# Patient Record
Sex: Female | Born: 1955 | Race: White | Hispanic: No | Marital: Married | Smoking: Former smoker
Health system: Southern US, Community
[De-identification: ages and names within clinical notes are randomized; demographics above are authoritative.]

## PROBLEM LIST (undated history)

## (undated) DIAGNOSIS — Z789 Other specified health status: Secondary | ICD-10-CM

## (undated) DIAGNOSIS — T7840XA Allergy, unspecified, initial encounter: Secondary | ICD-10-CM

## (undated) HISTORY — PX: WISDOM TOOTH EXTRACTION: SHX21

## (undated) HISTORY — PX: TONSILLECTOMY: SUR1361

## (undated) HISTORY — DX: Allergy, unspecified, initial encounter: T78.40XA

## (undated) HISTORY — PX: COLONOSCOPY: SHX174

## (undated) HISTORY — DX: Other specified health status: Z78.9

---

## 2005-12-26 LAB — HM COLONOSCOPY

## 2006-12-26 LAB — HM PAP SMEAR: HM Pap smear: NORMAL

## 2008-01-25 LAB — CONVERTED CEMR LAB: Pap Smear: NORMAL

## 2008-12-26 LAB — HM MAMMOGRAPHY: HM Mammogram: NORMAL

## 2009-02-04 ENCOUNTER — Ambulatory Visit: Payer: Self-pay | Admitting: *Deleted

## 2009-02-04 ENCOUNTER — Ambulatory Visit: Payer: Self-pay | Admitting: Diagnostic Radiology

## 2009-02-04 ENCOUNTER — Ambulatory Visit (HOSPITAL_BASED_OUTPATIENT_CLINIC_OR_DEPARTMENT_OTHER): Admission: RE | Admit: 2009-02-04 | Discharge: 2009-02-04 | Payer: Self-pay | Admitting: *Deleted

## 2009-02-04 DIAGNOSIS — J309 Allergic rhinitis, unspecified: Secondary | ICD-10-CM | POA: Insufficient documentation

## 2009-02-04 DIAGNOSIS — E785 Hyperlipidemia, unspecified: Secondary | ICD-10-CM | POA: Insufficient documentation

## 2009-02-10 ENCOUNTER — Ambulatory Visit: Payer: Self-pay | Admitting: *Deleted

## 2009-02-10 LAB — CONVERTED CEMR LAB
ALT: 16 units/L (ref 0–35)
AST: 19 units/L (ref 0–37)
Albumin: 3.9 g/dL (ref 3.5–5.2)
Alkaline Phosphatase: 59 units/L (ref 39–117)
BUN: 9 mg/dL (ref 6–23)
Basophils Absolute: 0 10*3/uL (ref 0.0–0.1)
Basophils Relative: 0.6 % (ref 0.0–3.0)
CO2: 32 meq/L (ref 19–32)
Calcium: 9.3 mg/dL (ref 8.4–10.5)
Chloride: 106 meq/L (ref 96–112)
Cholesterol: 200 mg/dL (ref 0–200)
Creatinine, Ser: 0.8 mg/dL (ref 0.4–1.2)
Eosinophils Absolute: 0.3 10*3/uL (ref 0.0–0.7)
Eosinophils Relative: 5.7 % — ABNORMAL HIGH (ref 0.0–5.0)
GFR calc Af Amer: 96 mL/min
GFR calc non Af Amer: 79 mL/min
Glucose, Bld: 93 mg/dL (ref 70–99)
HCT: 41.7 % (ref 36.0–46.0)
HDL: 50.9 mg/dL (ref >39.0)
Hemoglobin: 14.3 g/dL (ref 12.0–15.0)
LDL Cholesterol: 131 mg/dL — ABNORMAL HIGH (ref 0–99)
Lymphocytes Relative: 29.7 % (ref 12.0–46.0)
MCHC: 34.4 g/dL (ref 30.0–36.0)
MCV: 87.2 fL (ref 78.0–100.0)
Monocytes Absolute: 0.5 10*3/uL (ref 0.1–1.0)
Monocytes Relative: 8.4 % (ref 3.0–12.0)
Neutro Abs: 3.3 10*3/uL (ref 1.4–7.7)
Neutrophils Relative %: 55.6 % (ref 43.0–77.0)
Platelets: 202 10*3/uL (ref 150–400)
Potassium: 3.8 meq/L (ref 3.5–5.1)
RBC: 4.78 M/uL (ref 3.87–5.11)
RDW: 11.9 % (ref 11.5–14.6)
Sodium: 144 meq/L (ref 135–145)
TSH: 1.07 microintl units/mL (ref 0.35–5.50)
Total Bilirubin: 0.8 mg/dL (ref 0.3–1.2)
Total CHOL/HDL Ratio: 3.9
Total Protein: 6.6 g/dL (ref 6.0–8.3)
Triglycerides: 92 mg/dL (ref 0–149)
VLDL: 18 mg/dL (ref 0–40)
WBC: 5.9 10*3/uL (ref 4.5–10.5)

## 2011-01-25 NOTE — Assessment & Plan Note (Signed)
Summary: NEW PT TO EST. CPX   Vital Signs:  Patient Profile:   55 Years Old Female LMP:     01/03/2008 Height:     66 inches Weight:      137 pounds BMI:     22.19 O2 treatment:    Room Air Temp:     98.0 degrees F oral Pulse rate:   80 / minute Pulse rhythm:   regular Resp:     18 per minute BP sitting:   110 / 78  (right arm) Cuff size:   regular  Vitals Entered By: Darra Lis RMA (February 04, 2009 10:24 AM)  Menstrual History: LMP (date): 01/03/2008 LMP - Character: light Menarche: 14 Menses interval: 28 days Menstrual flow: 7 days             Is Patient Diabetic? No     Visit Type:  new patient - PE PCP:  Paulo Fruit MD  Chief Complaint:  Establish care with a new doctor.Marland Kitchen  History of Present Illness: Patient is here for a physical, mammogram and labs - however patient is not fasting - so labs will be ordered for another day.  Patient with a history of  seasonal allergies and using over the counter claritin as needed   Patient with a history of  hyperlipidemia - borderline - never on meds - has controlled with diet / exercise   Per [employer] [employer] guidelines,  the patient meets criteria to forego the yearly pap smears - will do every other year.    Updated Prior Medication List: MULTIVITAMINS  TABS (MULTIPLE VITAMIN) Take 1 tablet by mouth once a day LORATADINE 10 MG TABS (LORATADINE) one tab by mouth once daily as needed  Current Allergies (reviewed today): No known allergies   Past Medical History:    Allergic rhinitis    thallium stress test 2006 within normal limits - done because of family history     Hyperlipidemia  Past Surgical History:    Tonsillectomy   Family History:    Family History of Alcoholism/Addiction    Family History Diabetes 1st degree relative    Family History High cholesterol    Family History Hypertension    Family History of  heart disease    Family History of  prostate  cancer  Social History:    Occupation:  administration    Married - second one - married 2009    4 children    Former Smoker    Alcohol use-yes   Risk Factors:  Tobacco use:  quit    Year quit:  1977    Pack-years:  5 years  10 cigs daily Passive smoke exposure:  no Drug use:  no HIV high-risk behavior:  no Caffeine use:  3 - patient advised to cut back drinks per day Alcohol use:  yes    Type:  wine    Has patient --       Felt need to cut down:  no       Been annoyed by complaints:  no       Felt guilty about drinking:  no       Needed eye opener in the morning:  no    Comments:  twice a year/ social    Counseled to quit/cut down alcohol use:  no Exercise:  yes    Times per week:  3    Type:  walking Seatbelt use:  100 % Sun Exposure:  occasionally  Family History Risk Factors:    Family History of MI in females < 51 years old:  no    Family History of MI in males < 37 years old:  yes  Mammogram History:     Date of Last Mammogram:  01/25/2008    Results:  Normal Bilateral   PAP Smear History:     Date of Last PAP Smear:  01/25/2008    Results:  Normal   Colonoscopy History:     Date of Last Colonoscopy:  04/25/2007    Results:  small polyp removed - repeat 5 years    Review of Systems  The patient denies anorexia, fever, weight loss, weight gain, vision loss, decreased hearing, hoarseness, chest pain, syncope, dyspnea on exertion, peripheral edema, prolonged cough, headaches, hemoptysis, abdominal pain, melena, hematochezia, severe indigestion/heartburn, hematuria, incontinence, genital sores, muscle weakness, suspicious skin lesions, transient blindness, difficulty walking, depression, unusual weight change, abnormal bleeding, enlarged lymph nodes, angioedema, and breast masses.     Physical Exam  Breasts:     No mass, nodules, thickening, tenderness, bulging, retraction, inflamation, nipple discharge or skin changes noted.   Additional Exam:      General:     Well-developed, well nourished, well hydrated, in no acute distress; appropriate and cooperative   Head:     Normocephalic and atraumatic without obvious abnormalities.  Eyes:     No corneal or conjunctival inflammation. EOMI. PERRLA. Funduscopic exam benign, Vision grossly normal. Ears:     External ear shows no significant lesions or deformities.  Otoscopic examination reveals clear canals, tympanic membranes normal bilaterally. Hearing is grossly normal. Nose:     External nasal examination shows no deformity or inflammation. Nasal mucosa are pink and moist without lesions or exudates. Mouth:     Oral mucosa and oropharynx without lesions, erythema or exudates.  Teeth in good repair. no postnasal drip and no tongue abnormalities.   Neck:     supple, full ROM, no masses, and no thyromegaly.   Chest Wall:     No deformities, masses, or tenderness noted. Lungs:     Normal respiratory effort, chest expands symmetrically with good air flow noted. Lungs clear to auscultation with no crackles, rales or wheezes.   Heart:     Normal rate and  rhythm. S1 and S2 normal, without gallop, murmur, click, rub  Abdomen:     Bowel sounds positive, abdomen soft and non-tender without masses, organomegaly or hernias noted. no distention  Msk:     No gross deformity noted of cervical, thoracic or lumbar spine. normal ROM. no muscle atrophy noted.   Extremities:     No clubbing, cyanosis, edema, or deformity noted, grossly normal full range of motion with all joints.   Neurologic:     alert & oriented X3,  gait normal.  no deficit in strength noted, no balance problems noted, neuro is grossly intact Skin:     Intact without suspicious lesions or rashes Psych:     Cognition and judgment appear intact. Alert and cooperative with normal attention span and concentration. No apparent delusions, illusions, hallucinations, normally interactive, good eye contact, not anxious or depressed appearing,  and not agitated.       Impression & Recommendations:  Problem # 1:  WELL ADULT (ICD-V70.0) preventive topics reviewed.  labs ordered.  Problem # 2:  OTHER SCREENING BREAST EXAMINATION (ICD-V76.19)  Orders: Mammogram (Screening) (Mammo)   Problem # 3:  HYPERLIPIDEMIA (ICD-272.4) reviewed diet / exercise and natural means of  controlling - check labs  Problem # 4:  SCREENING FOR THYROID DISORDER (ICD-V77.0) will check labs  Problem # 5:  SCREENING FOR DIABETES MELLITUS (ICD-V77.1) will check labs  Problem # 6:  ALLERGIC RHINITIS (ICD-477.9) continue meds as needed  Her updated medication list for this problem includes:    Loratadine 10 Mg Tabs (Loratadine) ..... One tab by mouth once daily as needed   Complete Medication List: 1)  Multivitamins Tabs (Multiple vitamin) .... Take 1 tablet by mouth once a day 2)  Loratadine 10 Mg Tabs (Loratadine) .... One tab by mouth once daily as needed   Patient Instructions: 1)  patient to schedule labs in the next few weeks 2)  CMP, ICD-9: v70.0, 272.4, v77.1, v77.0 3)  Lipid Panel, ICD-9:  v70.0, 272.4, v77.1, v77.0 4)  TSH, ICD-9:  v70.0, 272.4, v77.1, v77.0 5)  CBC w/ Diff, ICD-9:  v70.0, 272.4, v77.1, v77.0 6)  Please schedule a PE / pap appointment in 1 year 40 minutes - with MD in the am / fasting.  NOT just a lab appt.

## 2012-03-28 ENCOUNTER — Emergency Department
Admission: EM | Admit: 2012-03-28 | Discharge: 2012-03-28 | Disposition: A | Discharge status: Home / Self Care | Payer: Federal, State, Local not specified - PPO | Source: Home / Self Care | Attending: Emergency Medicine | Admitting: Emergency Medicine

## 2012-03-28 ENCOUNTER — Encounter: Payer: Self-pay | Admitting: Emergency Medicine

## 2012-03-28 DIAGNOSIS — J069 Acute upper respiratory infection, unspecified: Secondary | ICD-10-CM

## 2012-03-28 DIAGNOSIS — R05 Cough: Secondary | ICD-10-CM

## 2012-03-28 DIAGNOSIS — R059 Cough, unspecified: Secondary | ICD-10-CM

## 2012-03-28 MED ORDER — AZITHROMYCIN 250 MG PO TABS: ORAL_TABLET | ORAL | Status: AC

## 2012-03-28 MED ORDER — HYDROCODONE-HOMATROPINE 5-1.5 MG/5ML PO SYRP: 5.0000 mL | ORAL_SOLUTION | Freq: Four times a day (QID) | ORAL | Status: AC | PRN

## 2012-03-28 NOTE — ED Provider Notes (Signed)
History     CSN: [id number]  Arrival date & time 03/28/12  1330   First MD Initiated Contact with Patient 03/28/12 1335      Chief Complaint  Patient presents with  . Cough    (Consider location/radiation/quality/duration/timing/severity/associated sxs/prior treatment) HPI Alyssa Cross is a 56 y.o. female who complains of onset of cold symptoms for 2 weeks.  The symptoms are constant and mild-moderate in severity.  She is a history of mild allergic rhinitis but she states this is worse and it is lingering  and she thinks it may be dropping into her chest.  She is using over-the-counter cough and cold medicines which are helping a little bit.   +sore throat + dry cough No pleuritic pain No wheezing + nasal congestion + post-nasal drainage +sinus pain/pressure + chest congestion No itchy/red eyes No earache No hemoptysis No SOB + chills/sweats No fever No nausea No vomiting No abdominal pain No diarrhea No skin rashes No fatigue No myalgias No headache    History reviewed. No pertinent past medical history.  History reviewed. No pertinent past surgical history.  Family History  Problem Relation Age of Onset  . Diabetes Mother   . Heart failure Father     History  Substance Use Topics  . Smoking status: Former Games developer  . Smokeless tobacco: Not on file  . Alcohol Use: No    OB History    Grav Para Term Preterm Abortions TAB SAB Ect Mult Living                  Review of Systems  All other systems reviewed and are negative.    Allergies  Review of patient's allergies indicates no known allergies.  Home Medications   Current Outpatient Rx  Name Route Sig Dispense Refill  . AZITHROMYCIN 250 MG PO TABS  Use as directed 1 each 0  . HYDROCODONE-HOMATROPINE 5-1.5 MG/5ML PO SYRP Oral Take 5 mLs by mouth every 6 (six) hours as needed for cough. 120 mL 0    BP 116/76  Pulse 89  Temp(Src) 98.6 F (37 C) (Oral)  Resp 18  Ht 5\' 6"  (1.676 m)  Wt 140 lb  (63.504 kg)  BMI 22.60 kg/m2  SpO2 96%  Physical Exam  Nursing note and vitals reviewed. Constitutional: She is oriented to person, place, and time. She appears well-developed and well-nourished.  HENT:  Head: Normocephalic and atraumatic.  Right Ear: Tympanic membrane, external ear and ear canal normal.  Left Ear: Tympanic membrane, external ear and ear canal normal.  Nose: Mucosal edema present.  Mouth/Throat: Posterior oropharyngeal erythema (with moderate post nasal drip) present. No oropharyngeal exudate or posterior oropharyngeal edema.  Eyes: No scleral icterus.  Neck: Neck supple.  Cardiovascular: Regular rhythm and normal heart sounds.   Pulmonary/Chest: Effort normal and breath sounds normal. No respiratory distress.  Neurological: She is alert and oriented to person, place, and time.  Skin: Skin is warm and dry.  Psychiatric: She has a normal mood and affect. Her speech is normal.    ED Course  Procedures (including critical care time)  Labs Reviewed - No data to display No results found.   1. Acute upper respiratory infections of unspecified site   2. Cough       MDM  1)  Take the prescribed antibiotic as instructed. 2)  Use nasal saline solution (over the counter) at least 3 times a day. 3)  Use over the counter decongestants like Zyrtec-D every 12 hours as  needed to help with congestion.  If you have hypertension, do not take medicines with sudafed.  4)  Can take tylenol every 6 hours or motrin every 8 hours for pain or fever. 5)  Follow up with your primary doctor if no improvement in 5-7 days, sooner if increasing pain, fever, or new symptoms.     Marlaine Hind, MD 03/28/12 1355

## 2012-03-28 NOTE — ED Notes (Signed)
Dry Cough, sinus pressure, headache x 2 weeks

## 2014-11-04 ENCOUNTER — Ambulatory Visit (INDEPENDENT_AMBULATORY_CARE_PROVIDER_SITE_OTHER): Payer: Federal, State, Local not specified - PPO | Admitting: Family Medicine

## 2014-11-04 ENCOUNTER — Encounter: Payer: Self-pay | Admitting: Family Medicine

## 2014-11-04 VITALS — BP 126/81 | HR 72 | Temp 98.3°F | Ht 66.0 in | Wt 143.0 lb

## 2014-11-04 DIAGNOSIS — M546 Pain in thoracic spine: Secondary | ICD-10-CM

## 2014-11-04 DIAGNOSIS — Z23 Encounter for immunization: Secondary | ICD-10-CM

## 2014-11-04 DIAGNOSIS — J3089 Other allergic rhinitis: Secondary | ICD-10-CM

## 2014-11-04 DIAGNOSIS — E785 Hyperlipidemia, unspecified: Secondary | ICD-10-CM

## 2014-11-04 DIAGNOSIS — Z0000 Encounter for general adult medical examination without abnormal findings: Secondary | ICD-10-CM

## 2014-11-04 NOTE — Patient Instructions (Signed)
Preventive Care for Adults A healthy lifestyle and preventive care can promote health and wellness. Preventive health guidelines for women include the following key practices.  A routine yearly physical is a good way to check with your health care provider about your health and preventive screening. It is a chance to share any concerns and updates on your health and to receive a thorough exam.  Visit your dentist for a routine exam and preventive care every 6 months. Brush your teeth twice a day and floss once a day. Good oral hygiene prevents tooth decay and gum disease.  The frequency of eye exams is based on your age, health, family medical history, use of contact lenses, and other factors. Follow your health care provider's recommendations for frequency of eye exams.  Eat a healthy diet. Foods like vegetables, fruits, whole grains, low-fat dairy products, and lean protein foods contain the nutrients you need without too many calories. Decrease your intake of foods high in solid fats, added sugars, and salt. Eat the right amount of calories for you.Get information about a proper diet from your health care provider, if necessary.  Regular physical exercise is one of the most important things you can do for your health. Most adults should get at least 150 minutes of moderate-intensity exercise (any activity that increases your heart rate and causes you to sweat) each week. In addition, most adults need muscle-strengthening exercises on 2 or more days a week.  Maintain a healthy weight. The body mass index (BMI) is a screening tool to identify possible weight problems. It provides an estimate of body fat based on height and weight. Your health care provider can find your BMI and can help you achieve or maintain a healthy weight.For adults 20 years and older:  A BMI below 18.5 is considered underweight.  A BMI of 18.5 to 24.9 is normal.  A BMI of 25 to 29.9 is considered overweight.  A BMI of  30 and above is considered obese.  Maintain normal blood lipids and cholesterol levels by exercising and minimizing your intake of saturated fat. Eat a balanced diet with plenty of fruit and vegetables. Blood tests for lipids and cholesterol should begin at age 76 and be repeated every 5 years. If your lipid or cholesterol levels are high, you are over 50, or you are at high risk for heart disease, you may need your cholesterol levels checked more frequently.Ongoing high lipid and cholesterol levels should be treated with medicines if diet and exercise are not working.  If you smoke, find out from your health care provider how to quit. If you do not use tobacco, do not start.  Lung cancer screening is recommended for adults aged 22-80 years who are at high risk for developing lung cancer because of a history of smoking. A yearly low-dose CT scan of the lungs is recommended for people who have at least a 30-pack-year history of smoking and are a current smoker or have quit within the past 15 years. A pack year of smoking is smoking an average of 1 pack of cigarettes a day for 1 year (for example: 1 pack a day for 30 years or 2 packs a day for 15 years). Yearly screening should continue until the smoker has stopped smoking for at least 15 years. Yearly screening should be stopped for people who develop a health problem that would prevent them from having lung cancer treatment.  If you are pregnant, do not drink alcohol. If you are breastfeeding,  be very cautious about drinking alcohol. If you are not pregnant and choose to drink alcohol, do not have more than 1 drink per day. One drink is considered to be 12 ounces (355 mL) of beer, 5 ounces (148 mL) of wine, or 1.5 ounces (44 mL) of liquor.  Avoid use of street drugs. Do not share needles with anyone. Ask for help if you need support or instructions about stopping the use of drugs.  High blood pressure causes heart disease and increases the risk of  stroke. Your blood pressure should be checked at least every 1 to 2 years. Ongoing high blood pressure should be treated with medicines if weight loss and exercise do not work.  If you are 75-52 years old, ask your health care provider if you should take aspirin to prevent strokes.  Diabetes screening involves taking a blood sample to check your fasting blood sugar level. This should be done once every 3 years, after age 15, if you are within normal weight and without risk factors for diabetes. Testing should be considered at a younger age or be carried out more frequently if you are overweight and have at least 1 risk factor for diabetes.  Breast cancer screening is essential preventive care for women. You should practice "breast self-awareness." This means understanding the normal appearance and feel of your breasts and may include breast self-examination. Any changes detected, no matter how small, should be reported to a health care provider. Women in their 58s and 30s should have a clinical breast exam (CBE) by a health care provider as part of a regular health exam every 1 to 3 years. After age 16, women should have a CBE every year. Starting at age 53, women should consider having a mammogram (breast X-ray test) every year. Women who have a family history of breast cancer should talk to their health care provider about genetic screening. Women at a high risk of breast cancer should talk to their health care providers about having an MRI and a mammogram every year.  Breast cancer gene (BRCA)-related cancer risk assessment is recommended for women who have family members with BRCA-related cancers. BRCA-related cancers include breast, ovarian, tubal, and peritoneal cancers. Having family members with these cancers may be associated with an increased risk for harmful changes (mutations) in the breast cancer genes BRCA1 and BRCA2. Results of the assessment will determine the need for genetic counseling and  BRCA1 and BRCA2 testing.  Routine pelvic exams to screen for cancer are no longer recommended for nonpregnant women who are considered low risk for cancer of the pelvic organs (ovaries, uterus, and vagina) and who do not have symptoms. Ask your health care provider if a screening pelvic exam is right for you.  If you have had past treatment for cervical cancer or a condition that could lead to cancer, you need Pap tests and screening for cancer for at least 20 years after your treatment. If Pap tests have been discontinued, your risk factors (such as having a new sexual partner) need to be reassessed to determine if screening should be resumed. Some women have medical problems that increase the chance of getting cervical cancer. In these cases, your health care provider may recommend more frequent screening and Pap tests.  The HPV test is an additional test that may be used for cervical cancer screening. The HPV test looks for the virus that can cause the cell changes on the cervix. The cells collected during the Pap test can be  tested for HPV. The HPV test could be used to screen women aged 30 years and older, and should be used in women of any age who have unclear Pap test results. After the age of 30, women should have HPV testing at the same frequency as a Pap test.  Colorectal cancer can be detected and often prevented. Most routine colorectal cancer screening begins at the age of 50 years and continues through age 75 years. However, your health care provider may recommend screening at an earlier age if you have risk factors for colon cancer. On a yearly basis, your health care provider may provide home test kits to check for hidden blood in the stool. Use of a small camera at the end of a tube, to directly examine the colon (sigmoidoscopy or colonoscopy), can detect the earliest forms of colorectal cancer. Talk to your health care provider about this at age 50, when routine screening begins. Direct  exam of the colon should be repeated every 5-10 years through age 75 years, unless early forms of pre-cancerous polyps or small growths are found.  People who are at an increased risk for hepatitis B should be screened for this virus. You are considered at high risk for hepatitis B if:  You were born in a country where hepatitis B occurs often. Talk with your health care provider about which countries are considered high risk.  Your parents were born in a high-risk country and you have not received a shot to protect against hepatitis B (hepatitis B vaccine).  You have HIV or AIDS.  You use needles to inject street drugs.  You live with, or have sex with, someone who has hepatitis B.  You get hemodialysis treatment.  You take certain medicines for conditions like cancer, organ transplantation, and autoimmune conditions.  Hepatitis C blood testing is recommended for all people born from 1945 through 1965 and any individual with known risks for hepatitis C.  Practice safe sex. Use condoms and avoid high-risk sexual practices to reduce the spread of sexually transmitted infections (STIs). STIs include gonorrhea, chlamydia, syphilis, trichomonas, herpes, HPV, and human immunodeficiency virus (HIV). Herpes, HIV, and HPV are viral illnesses that have no cure. They can result in disability, cancer, and death.  You should be screened for sexually transmitted illnesses (STIs) including gonorrhea and chlamydia if:  You are sexually active and are younger than 24 years.  You are older than 24 years and your health care provider tells you that you are at risk for this type of infection.  Your sexual activity has changed since you were last screened and you are at an increased risk for chlamydia or gonorrhea. Ask your health care provider if you are at risk.  If you are at risk of being infected with HIV, it is recommended that you take a prescription medicine daily to prevent HIV infection. This is  called preexposure prophylaxis (PrEP). You are considered at risk if:  You are a heterosexual woman, are sexually active, and are at increased risk for HIV infection.  You take drugs by injection.  You are sexually active with a partner who has HIV.  Talk with your health care provider about whether you are at high risk of being infected with HIV. If you choose to begin PrEP, you should first be tested for HIV. You should then be tested every 3 months for as long as you are taking PrEP.  Osteoporosis is a disease in which the bones lose minerals and strength   with aging. This can result in serious bone fractures or breaks. The risk of osteoporosis can be identified using a bone density scan. Women ages 65 years and over and women at risk for fractures or osteoporosis should discuss screening with their health care providers. Ask your health care provider whether you should take a calcium supplement or vitamin D to reduce the rate of osteoporosis.  Menopause can be associated with physical symptoms and risks. Hormone replacement therapy is available to decrease symptoms and risks. You should talk to your health care provider about whether hormone replacement therapy is right for you.  Use sunscreen. Apply sunscreen liberally and repeatedly throughout the day. You should seek shade when your shadow is shorter than you. Protect yourself by wearing long sleeves, pants, a wide-brimmed hat, and sunglasses year round, whenever you are outdoors.  Once a month, do a whole body skin exam, using a mirror to look at the skin on your back. Tell your health care provider of new moles, moles that have irregular borders, moles that are larger than a pencil eraser, or moles that have changed in shape or color.  Stay current with required vaccines (immunizations).  Influenza vaccine. All adults should be immunized every year.  Tetanus, diphtheria, and acellular pertussis (Td, Tdap) vaccine. Pregnant women should  receive 1 dose of Tdap vaccine during each pregnancy. The dose should be obtained regardless of the length of time since the last dose. Immunization is preferred during the 27th-36th week of gestation. An adult who has not previously received Tdap or who does not know her vaccine status should receive 1 dose of Tdap. This initial dose should be followed by tetanus and diphtheria toxoids (Td) booster doses every 10 years. Adults with an unknown or incomplete history of completing a 3-dose immunization series with Td-containing vaccines should begin or complete a primary immunization series including a Tdap dose. Adults should receive a Td booster every 10 years.  Varicella vaccine. An adult without evidence of immunity to varicella should receive 2 doses or a second dose if she has previously received 1 dose. Pregnant females who do not have evidence of immunity should receive the first dose after pregnancy. This first dose should be obtained before leaving the health care facility. The second dose should be obtained 4-8 weeks after the first dose.  Human papillomavirus (HPV) vaccine. Females aged 13-26 years who have not received the vaccine previously should obtain the 3-dose series. The vaccine is not recommended for use in pregnant females. However, pregnancy testing is not needed before receiving a dose. If a female is found to be pregnant after receiving a dose, no treatment is needed. In that case, the remaining doses should be delayed until after the pregnancy. Immunization is recommended for any person with an immunocompromised condition through the age of 26 years if she did not get any or all doses earlier. During the 3-dose series, the second dose should be obtained 4-8 weeks after the first dose. The third dose should be obtained 24 weeks after the first dose and 16 weeks after the second dose.  Zoster vaccine. One dose is recommended for adults aged 60 years or older unless certain conditions are  present.  Measles, mumps, and rubella (MMR) vaccine. Adults born before 1957 generally are considered immune to measles and mumps. Adults born in 1957 or later should have 1 or more doses of MMR vaccine unless there is a contraindication to the vaccine or there is laboratory evidence of immunity to   each of the three diseases. A routine second dose of MMR vaccine should be obtained at least 28 days after the first dose for students attending postsecondary schools, health care workers, or international travelers. People who received inactivated measles vaccine or an unknown type of measles vaccine during 1963-1967 should receive 2 doses of MMR vaccine. People who received inactivated mumps vaccine or an unknown type of mumps vaccine before 1979 and are at high risk for mumps infection should consider immunization with 2 doses of MMR vaccine. For females of childbearing age, rubella immunity should be determined. If there is no evidence of immunity, females who are not pregnant should be vaccinated. If there is no evidence of immunity, females who are pregnant should delay immunization until after pregnancy. Unvaccinated health care workers born before 1957 who lack laboratory evidence of measles, mumps, or rubella immunity or laboratory confirmation of disease should consider measles and mumps immunization with 2 doses of MMR vaccine or rubella immunization with 1 dose of MMR vaccine.  Pneumococcal 13-valent conjugate (PCV13) vaccine. When indicated, a person who is uncertain of her immunization history and has no record of immunization should receive the PCV13 vaccine. An adult aged 19 years or older who has certain medical conditions and has not been previously immunized should receive 1 dose of PCV13 vaccine. This PCV13 should be followed with a dose of pneumococcal polysaccharide (PPSV23) vaccine. The PPSV23 vaccine dose should be obtained at least 8 weeks after the dose of PCV13 vaccine. An adult aged 19  years or older who has certain medical conditions and previously received 1 or more doses of PPSV23 vaccine should receive 1 dose of PCV13. The PCV13 vaccine dose should be obtained 1 or more years after the last PPSV23 vaccine dose.  Pneumococcal polysaccharide (PPSV23) vaccine. When PCV13 is also indicated, PCV13 should be obtained first. All adults aged 65 years and older should be immunized. An adult younger than age 65 years who has certain medical conditions should be immunized. Any person who resides in a nursing home or long-term care facility should be immunized. An adult smoker should be immunized. People with an immunocompromised condition and certain other conditions should receive both PCV13 and PPSV23 vaccines. People with human immunodeficiency virus (HIV) infection should be immunized as soon as possible after diagnosis. Immunization during chemotherapy or radiation therapy should be avoided. Routine use of PPSV23 vaccine is not recommended for American Indians, [place] Natives, or people younger than 65 years unless there are medical conditions that require PPSV23 vaccine. When indicated, people who have unknown immunization and have no record of immunization should receive PPSV23 vaccine. One-time revaccination 5 years after the first dose of PPSV23 is recommended for people aged 19-64 years who have chronic kidney failure, nephrotic syndrome, asplenia, or immunocompromised conditions. People who received 1-2 doses of PPSV23 before age 65 years should receive another dose of PPSV23 vaccine at age 65 years or later if at least 5 years have passed since the previous dose. Doses of PPSV23 are not needed for people immunized with PPSV23 at or after age 65 years.  Meningococcal vaccine. Adults with asplenia or persistent complement component deficiencies should receive 2 doses of quadrivalent meningococcal conjugate (MenACWY-D) vaccine. The doses should be obtained at least 2 months apart.  Microbiologists working with certain meningococcal bacteria, military recruits, people at risk during an outbreak, and people who travel to or live in countries with a high rate of meningitis should be immunized. A first-year college student up through age   21 years who is living in a residence hall should receive a dose if she did not receive a dose on or after her 16th birthday. Adults who have certain high-risk conditions should receive one or more doses of vaccine.  Hepatitis A vaccine. Adults who wish to be protected from this disease, have certain high-risk conditions, work with hepatitis A-infected animals, work in hepatitis A research labs, or travel to or work in countries with a high rate of hepatitis A should be immunized. Adults who were previously unvaccinated and who anticipate close contact with an international adoptee during the first 60 days after arrival in the [place] from a country with a high rate of hepatitis A should be immunized.  Hepatitis B vaccine. Adults who wish to be protected from this disease, have certain high-risk conditions, may be exposed to blood or other infectious body fluids, are household contacts or sex partners of hepatitis B positive people, are clients or workers in certain care facilities, or travel to or work in countries with a high rate of hepatitis B should be immunized.  Haemophilus influenzae type b (Hib) vaccine. A previously unvaccinated person with asplenia or sickle cell disease or having a scheduled splenectomy should receive 1 dose of Hib vaccine. Regardless of previous immunization, a recipient of a hematopoietic stem cell transplant should receive a 3-dose series 6-12 months after her successful transplant. Hib vaccine is not recommended for adults with HIV infection. Preventive Services / Frequency Ages 64 to 68 years  Blood pressure check.** / Every 1 to 2 years.  Lipid and cholesterol check.** / Every 5 years beginning at age  22.  Clinical breast exam.** / Every 3 years for women in their 88s and 53s.  BRCA-related cancer risk assessment.** / For women who have family members with a BRCA-related cancer (breast, ovarian, tubal, or peritoneal cancers).  Pap test.** / Every 2 years from ages 90 through 51. Every 3 years starting at age 21 through age 56 or 3 with a history of 3 consecutive normal Pap tests.  HPV screening.** / Every 3 years from ages 24 through ages 1 to 46 with a history of 3 consecutive normal Pap tests.  Hepatitis C blood test.** / For any individual with known risks for hepatitis C.  Skin self-exam. / Monthly.  Influenza vaccine. / Every year.  Tetanus, diphtheria, and acellular pertussis (Tdap, Td) vaccine.** / Consult your health care provider. Pregnant women should receive 1 dose of Tdap vaccine during each pregnancy. 1 dose of Td every 10 years.  Varicella vaccine.** / Consult your health care provider. Pregnant females who do not have evidence of immunity should receive the first dose after pregnancy.  HPV vaccine. / 3 doses over 6 months, if 72 and younger. The vaccine is not recommended for use in pregnant females. However, pregnancy testing is not needed before receiving a dose.  Measles, mumps, rubella (MMR) vaccine.** / You need at least 1 dose of MMR if you were born in 1957 or later. You may also need a 2nd dose. For females of childbearing age, rubella immunity should be determined. If there is no evidence of immunity, females who are not pregnant should be vaccinated. If there is no evidence of immunity, females who are pregnant should delay immunization until after pregnancy.  Pneumococcal 13-valent conjugate (PCV13) vaccine.** / Consult your health care provider.  Pneumococcal polysaccharide (PPSV23) vaccine.** / 1 to 2 doses if you smoke cigarettes or if you have certain conditions.  Meningococcal vaccine.** /  1 dose if you are age 19 to 21 years and a first-year college  student living in a residence hall, or have one of several medical conditions, you need to get vaccinated against meningococcal disease. You may also need additional booster doses.  Hepatitis A vaccine.** / Consult your health care provider.  Hepatitis B vaccine.** / Consult your health care provider.  Haemophilus influenzae type b (Hib) vaccine.** / Consult your health care provider. Ages 40 to 64 years  Blood pressure check.** / Every 1 to 2 years.  Lipid and cholesterol check.** / Every 5 years beginning at age 20 years.  Lung cancer screening. / Every year if you are aged 55-80 years and have a 30-pack-year history of smoking and currently smoke or have quit within the past 15 years. Yearly screening is stopped once you have quit smoking for at least 15 years or develop a health problem that would prevent you from having lung cancer treatment.  Clinical breast exam.** / Every year after age 40 years.  BRCA-related cancer risk assessment.** / For women who have family members with a BRCA-related cancer (breast, ovarian, tubal, or peritoneal cancers).  Mammogram.** / Every year beginning at age 40 years and continuing for as long as you are in good health. Consult with your health care provider.  Pap test.** / Every 3 years starting at age 30 years through age 65 or 70 years with a history of 3 consecutive normal Pap tests.  HPV screening.** / Every 3 years from ages 30 years through ages 65 to 70 years with a history of 3 consecutive normal Pap tests.  Fecal occult blood test (FOBT) of stool. / Every year beginning at age 50 years and continuing until age 75 years. You may not need to do this test if you get a colonoscopy every 10 years.  Flexible sigmoidoscopy or colonoscopy.** / Every 5 years for a flexible sigmoidoscopy or every 10 years for a colonoscopy beginning at age 50 years and continuing until age 75 years.  Hepatitis C blood test.** / For all people born from 1945 through  1965 and any individual with known risks for hepatitis C.  Skin self-exam. / Monthly.  Influenza vaccine. / Every year.  Tetanus, diphtheria, and acellular pertussis (Tdap/Td) vaccine.** / Consult your health care provider. Pregnant women should receive 1 dose of Tdap vaccine during each pregnancy. 1 dose of Td every 10 years.  Varicella vaccine.** / Consult your health care provider. Pregnant females who do not have evidence of immunity should receive the first dose after pregnancy.  Zoster vaccine.** / 1 dose for adults aged 60 years or older.  Measles, mumps, rubella (MMR) vaccine.** / You need at least 1 dose of MMR if you were born in 1957 or later. You may also need a 2nd dose. For females of childbearing age, rubella immunity should be determined. If there is no evidence of immunity, females who are not pregnant should be vaccinated. If there is no evidence of immunity, females who are pregnant should delay immunization until after pregnancy.  Pneumococcal 13-valent conjugate (PCV13) vaccine.** / Consult your health care provider.  Pneumococcal polysaccharide (PPSV23) vaccine.** / 1 to 2 doses if you smoke cigarettes or if you have certain conditions.  Meningococcal vaccine.** / Consult your health care provider.  Hepatitis A vaccine.** / Consult your health care provider.  Hepatitis B vaccine.** / Consult your health care provider.  Haemophilus influenzae type b (Hib) vaccine.** / Consult your health care provider. Ages 65   years and over  Blood pressure check.** / Every 1 to 2 years.  Lipid and cholesterol check.** / Every 5 years beginning at age 22 years.  Lung cancer screening. / Every year if you are aged 73-80 years and have a 30-pack-year history of smoking and currently smoke or have quit within the past 15 years. Yearly screening is stopped once you have quit smoking for at least 15 years or develop a health problem that would prevent you from having lung cancer  treatment.  Clinical breast exam.** / Every year after age 4 years.  BRCA-related cancer risk assessment.** / For women who have family members with a BRCA-related cancer (breast, ovarian, tubal, or peritoneal cancers).  Mammogram.** / Every year beginning at age 40 years and continuing for as long as you are in good health. Consult with your health care provider.  Pap test.** / Every 3 years starting at age 9 years through age 34 or 91 years with 3 consecutive normal Pap tests. Testing can be stopped between 65 and 70 years with 3 consecutive normal Pap tests and no abnormal Pap or HPV tests in the past 10 years.  HPV screening.** / Every 3 years from ages 57 years through ages 64 or 45 years with a history of 3 consecutive normal Pap tests. Testing can be stopped between 65 and 70 years with 3 consecutive normal Pap tests and no abnormal Pap or HPV tests in the past 10 years.  Fecal occult blood test (FOBT) of stool. / Every year beginning at age 15 years and continuing until age 17 years. You may not need to do this test if you get a colonoscopy every 10 years.  Flexible sigmoidoscopy or colonoscopy.** / Every 5 years for a flexible sigmoidoscopy or every 10 years for a colonoscopy beginning at age 86 years and continuing until age 71 years.  Hepatitis C blood test.** / For all people born from [birth year] through 1967 and any individual with known risks for hepatitis C.  Osteoporosis screening.** / A one-time screening for women ages 83 years and over and women at risk for fractures or osteoporosis.  Skin self-exam. / Monthly.  Influenza vaccine. / Every year.  Tetanus, diphtheria, and acellular pertussis (Tdap/Td) vaccine.** / 1 dose of Td every 10 years.  Varicella vaccine.** / Consult your health care provider.  Zoster vaccine.** / 1 dose for adults aged 61 years or older.  Pneumococcal 13-valent conjugate (PCV13) vaccine.** / Consult your health care provider.  Pneumococcal  polysaccharide (PPSV23) vaccine.** / 1 dose for all adults aged 28 years and older.  Meningococcal vaccine.** / Consult your health care provider.  Hepatitis A vaccine.** / Consult your health care provider.  Hepatitis B vaccine.** / Consult your health care provider.  Haemophilus influenzae type b (Hib) vaccine.** / Consult your health care provider. ** Family history and personal history of risk and conditions may change your health care provider's recommendations. Document Released: 02/07/2002 Document Revised: 04/28/2014 Document Reviewed: 05/09/2011 [facility] Patient Information 2015 [place], [place]. This information is not intended to replace advice given to you by your health care provider. Make sure you discuss any questions you have with your health care provider.

## 2014-11-04 NOTE — Progress Notes (Signed)
Patient ID: Alyssa Cross, female   DOB: 1956/05/21, 58 y.o.   MRN: [id number] Alyssa Cross [id number] [birth year] 11/04/2014      Progress Note-Follow Up  Subjective  Chief Complaint  Chief Complaint  Patient presents with  . Establish Care    new patient  . Injections    tdap    HPI  Patient is a 58 year old female in today for routine medical care. Patient is in today to establish care. Her major complaints are musculoskeletal. She struggles with stiffness in her low back and some intermittent pain and stiffness in left shoulder. No recent illness. Uses tylenol pm occasionally for insomnia. Denies CP/palp/SOB/HA/congestion/fevers/GI or GU c/o. Taking meds as prescribed  No past medical history on file.  Past Surgical History  Procedure Laterality Date  . Tonsillectomy  58 yrs old  . Wisdom tooth extraction      Family History  Problem Relation Age of Onset  . Diabetes Mother     type 2  . Heart disease Mother   . Heart failure Father   . Cancer Father     prostate  . Hypertension Sister     History   Social History  . Marital Status: Married    Spouse Name: N/A    Number of Children: N/A  . Years of Education: N/A   Occupational History  . Not on file.   Social History Main Topics  . Smoking status: Former Smoker -- 0.50 packs/day for 5 years    Types: Cigarettes    Quit date: 12/26/1973  . Smokeless tobacco: Never Used  . Alcohol Use: No  . Drug Use: No  . Sexual Activity: Not Currently   Other Topics Concern  . Not on file   Social History Narrative    No current outpatient prescriptions on file prior to visit.   No current facility-administered medications on file prior to visit.    No Known Allergies  Review of Systems  Review of Systems  Constitutional: Negative for fever, chills and malaise/fatigue.  HENT: Negative for congestion, hearing loss and nosebleeds.   Eyes: Negative for discharge.  Respiratory: Negative for cough, sputum  production, shortness of breath and wheezing.   Cardiovascular: Negative for chest pain, palpitations and leg swelling.  Gastrointestinal: Negative for heartburn, nausea, vomiting, abdominal pain, diarrhea, constipation and blood in stool.  Genitourinary: Negative for dysuria, urgency, frequency and hematuria.  Musculoskeletal: Negative for myalgias, back pain and falls.  Skin: Negative for rash.  Neurological: Negative for dizziness, tremors, sensory change, focal weakness, loss of consciousness, weakness and headaches.  Endo/Heme/Allergies: Negative for polydipsia. Does not bruise/bleed easily.  Psychiatric/Behavioral: Negative for depression and suicidal ideas. The patient is not nervous/anxious and does not have insomnia.     Objective  BP 126/81 mmHg  Pulse 72  Temp(Src) 98.3 F (36.8 C) (Oral)  Ht 5\' 6"  (1.676 m)  Wt 143 lb (64.864 kg)  BMI 23.09 kg/m2  SpO2 100%  Physical Exam  Physical Exam  Constitutional: She is oriented to person, place, and time and well-developed, well-nourished, and in no distress. No distress.  HENT:  Head: Normocephalic and atraumatic.  Right Ear: External ear normal.  Left Ear: External ear normal.  Nose: Nose normal.  Mouth/Throat: Oropharynx is clear and moist. No oropharyngeal exudate.  Eyes: Conjunctivae are normal. Pupils are equal, round, and reactive to light. Right eye exhibits no discharge. Left eye exhibits no discharge. No scleral icterus.  Neck: Normal range of motion. Neck supple. No  thyromegaly present.  Cardiovascular: Normal rate, regular rhythm, normal heart sounds and intact distal pulses.   No murmur heard. Pulmonary/Chest: Effort normal and breath sounds normal. No respiratory distress. She has no wheezes. She has no rales.  Abdominal: Soft. Bowel sounds are normal. She exhibits no distension and no mass. There is no tenderness.  Musculoskeletal: Normal range of motion. She exhibits no edema or tenderness.  Lymphadenopathy:     She has no cervical adenopathy.  Neurological: She is alert and oriented to person, place, and time. She has normal reflexes. No cranial nerve deficit. Coordination normal.  Skin: Skin is warm and dry. No rash noted. She is not diaphoretic.  Psychiatric: Mood, memory and affect normal.    Lab Results  Component Value Date   TSH 1.07 02/10/2009   Lab Results  Component Value Date   WBC 5.9 02/10/2009   HGB 14.3 02/10/2009   HCT 41.7 02/10/2009   MCV 87.2 02/10/2009   PLT 202 02/10/2009   Lab Results  Component Value Date   CREATININE 0.8 02/10/2009   BUN 9 02/10/2009   NA 144 02/10/2009   K 3.8 02/10/2009   CL 106 02/10/2009   CO2 32 02/10/2009   Lab Results  Component Value Date   ALT 16 02/10/2009   AST 19 02/10/2009   ALKPHOS 59 02/10/2009   BILITOT 0.8 02/10/2009   Lab Results  Component Value Date   CHOL 200 02/10/2009   Lab Results  Component Value Date   HDL 50.9 02/10/2009   Lab Results  Component Value Date   LDLCALC 131* 02/10/2009   Lab Results  Component Value Date   TRIG 92 02/10/2009   Lab Results  Component Value Date   CHOLHDL 3.9 CALC 02/10/2009     Assessment & Plan  Thoracic back pain Encouraged moist heat and gentle stretching as tolerated. May try NSAIDs and prescription meds as directed and report if symptoms worsen or seek immediate care  Allergic rhinitis Infrequent meds, may use OTC antihistamines.   Hyperlipidemia Encouraged heart healthy diet, increase exercise, avoid trans fats, consider a krill oil cap daily  Preventative health care Annual labs are ordered for initial evaluation of patient. She was given Tdap today and she had her flu shot on 10/19/14. Patient encouraged to maintain heart healthy diet, regular exercise, adequate sleep. Consider daily probiotics. Take medications as prescribed

## 2014-11-04 NOTE — Progress Notes (Signed)
Pre visit review using our clinic review tool, if applicable. No additional management support is needed unless otherwise documented below in the visit note. 

## 2014-11-05 LAB — CBC
HCT: 41 % (ref 36.0–46.0)
Hemoglobin: 13.9 g/dL (ref 12.0–15.0)
MCHC: 34 g/dL (ref 30.0–36.0)
MCV: 85.5 fl (ref 78.0–100.0)
Platelets: 271 10*3/uL (ref 150.0–400.0)
RBC: 4.8 Mil/uL (ref 3.87–5.11)
RDW: 12.6 % (ref 11.5–15.5)
WBC: 9 10*3/uL (ref 4.0–10.5)

## 2014-11-05 LAB — HEPATIC FUNCTION PANEL
ALT: 15 U/L (ref 0–35)
AST: 19 U/L (ref 0–37)
Albumin: 3.9 g/dL (ref 3.5–5.2)
Alkaline Phosphatase: 54 U/L (ref 39–117)
Bilirubin, Direct: 0 mg/dL (ref 0.0–0.3)
Total Bilirubin: 0.5 mg/dL (ref 0.2–1.2)
Total Protein: 7.3 g/dL (ref 6.0–8.3)

## 2014-11-05 LAB — LIPID PANEL
Cholesterol: 235 mg/dL — ABNORMAL HIGH (ref 0–200)
HDL: 54.3 mg/dL (ref >39.00)
LDL Cholesterol: 152 mg/dL — ABNORMAL HIGH (ref 0–99)
NonHDL: 180.7
Total CHOL/HDL Ratio: 4
Triglycerides: 143 mg/dL (ref 0.0–149.0)
VLDL: 28.6 mg/dL (ref 0.0–40.0)

## 2014-11-05 LAB — RENAL FUNCTION PANEL
Albumin: 3.9 g/dL (ref 3.5–5.2)
BUN: 12 mg/dL (ref 6–23)
CO2: 28 mEq/L (ref 19–32)
Calcium: 9.4 mg/dL (ref 8.4–10.5)
Chloride: 104 mEq/L (ref 96–112)
Creatinine, Ser: 0.7 mg/dL (ref 0.4–1.2)
GFR: 85.08 mL/min (ref >60.00)
Glucose, Bld: 79 mg/dL (ref 70–99)
Phosphorus: 2.7 mg/dL (ref 2.3–4.6)
Potassium: 4.2 mEq/L (ref 3.5–5.1)
Sodium: 140 mEq/L (ref 135–145)

## 2014-11-05 LAB — TSH: TSH: 0.69 u[IU]/mL (ref 0.35–4.50)

## 2014-11-09 ENCOUNTER — Encounter: Payer: Self-pay | Admitting: Family Medicine

## 2014-11-09 DIAGNOSIS — Z0000 Encounter for general adult medical examination without abnormal findings: Secondary | ICD-10-CM | POA: Insufficient documentation

## 2014-11-09 NOTE — Assessment & Plan Note (Signed)
Infrequent meds, may use OTC antihistamines.

## 2014-11-09 NOTE — Assessment & Plan Note (Signed)
Annual labs are ordered for initial evaluation of patient. She was given Tdap today and she had her flu shot on 10/19/14. Patient encouraged to maintain heart healthy diet, regular exercise, adequate sleep. Consider daily probiotics. Take medications as prescribed

## 2014-11-09 NOTE — Assessment & Plan Note (Signed)
Encouraged heart healthy diet, increase exercise, avoid trans fats, consider a krill oil cap daily 

## 2014-11-09 NOTE — Assessment & Plan Note (Signed)
Encouraged moist heat and gentle stretching as tolerated. May try NSAIDs and prescription meds as directed and report if symptoms worsen or seek immediate care 

## 2015-03-31 ENCOUNTER — Ambulatory Visit: Payer: Federal, State, Local not specified - PPO | Admitting: Family Medicine

## 2015-05-05 ENCOUNTER — Encounter: Payer: Self-pay | Admitting: Family Medicine

## 2015-06-18 ENCOUNTER — Ambulatory Visit: Payer: Federal, State, Local not specified - PPO | Admitting: Family Medicine

## 2018-05-02 ENCOUNTER — Ambulatory Visit: Payer: Federal, State, Local not specified - PPO | Admitting: Family Medicine

## 2018-05-03 ENCOUNTER — Ambulatory Visit: Payer: Federal, State, Local not specified - PPO | Admitting: Family Medicine

## 2018-05-03 ENCOUNTER — Encounter: Payer: Self-pay | Admitting: Family Medicine

## 2018-05-03 VITALS — BP 102/62 | HR 80 | Temp 98.7°F | Ht 66.0 in | Wt 144.1 lb

## 2018-05-03 DIAGNOSIS — Z0000 Encounter for general adult medical examination without abnormal findings: Secondary | ICD-10-CM

## 2018-05-03 DIAGNOSIS — Z1239 Encounter for other screening for malignant neoplasm of breast: Secondary | ICD-10-CM

## 2018-05-03 DIAGNOSIS — Z114 Encounter for screening for human immunodeficiency virus [HIV]: Secondary | ICD-10-CM

## 2018-05-03 DIAGNOSIS — Z1159 Encounter for screening for other viral diseases: Secondary | ICD-10-CM

## 2018-05-03 DIAGNOSIS — Z1211 Encounter for screening for malignant neoplasm of colon: Secondary | ICD-10-CM

## 2018-05-03 DIAGNOSIS — Z1231 Encounter for screening mammogram for malignant neoplasm of breast: Secondary | ICD-10-CM | POA: Diagnosis not present

## 2018-05-03 LAB — CBC
HCT: 41.6 % (ref 36.0–46.0)
Hemoglobin: 14.2 g/dL (ref 12.0–15.0)
MCHC: 34.1 g/dL (ref 30.0–36.0)
MCV: 86.8 fl (ref 78.0–100.0)
Platelets: 279 10*3/uL (ref 150.0–400.0)
RBC: 4.79 Mil/uL (ref 3.87–5.11)
RDW: 12.7 % (ref 11.5–15.5)
WBC: 6.5 10*3/uL (ref 4.0–10.5)

## 2018-05-03 LAB — COMPREHENSIVE METABOLIC PANEL
ALT: 13 U/L (ref 0–35)
AST: 14 U/L (ref 0–37)
Albumin: 4.1 g/dL (ref 3.5–5.2)
Alkaline Phosphatase: 55 U/L (ref 39–117)
BUN: 11 mg/dL (ref 6–23)
CO2: 31 mEq/L (ref 19–32)
Calcium: 9.1 mg/dL (ref 8.4–10.5)
Chloride: 106 mEq/L (ref 96–112)
Creatinine, Ser: 0.79 mg/dL (ref 0.40–1.20)
GFR: 78 mL/min (ref >60.00)
Glucose, Bld: 93 mg/dL (ref 70–99)
Potassium: 3.9 mEq/L (ref 3.5–5.1)
Sodium: 142 mEq/L (ref 135–145)
Total Bilirubin: 0.4 mg/dL (ref 0.2–1.2)
Total Protein: 6.5 g/dL (ref 6.0–8.3)

## 2018-05-03 LAB — LIPID PANEL
Cholesterol: 185 mg/dL (ref 0–200)
HDL: 48.2 mg/dL (ref >39.00)
LDL Cholesterol: 114 mg/dL — ABNORMAL HIGH (ref 0–99)
NonHDL: 136.96
Total CHOL/HDL Ratio: 4
Triglycerides: 113 mg/dL (ref 0.0–149.0)
VLDL: 22.6 mg/dL (ref 0.0–40.0)

## 2018-05-03 NOTE — Progress Notes (Signed)
Chief Complaint  Patient presents with  . Establish Care    CPE     Well Woman Alyssa Cross is here for a complete physical. She is re-establishing care. Her last physical was >1 year ago.  Current diet: in general, a "healthy" diet. Current exercise: Physically active with gardening and housework. Weight is stable and she denies daytime fatigue. No LMP recorded. (Menstrual status: Other). Seatbelt? Yes   Health Maintenance Pap/HPV- No  Colonoscopy- no Mammogram- No Tetanus- Yes Hep C screening- No HIV screening- No  Past Medical History:  Diagnosis Date  . No known health problems      Past Surgical History:  Procedure Laterality Date  . TONSILLECTOMY  62 yrs old  . WISDOM TOOTH EXTRACTION      Medications  Takes no meds routinely   Allergies No Known Allergies  Review of Systems: Constitutional:  no unexpected weight changes Eye:  no recent significant change in vision Ear/Nose/Mouth/Throat:  Ears:  no tinnitus or vertigo and no recent change in hearing Nose/Mouth/Throat:  no complaints of nasal congestion, no sore throat Cardiovascular: no chest pain Respiratory:  no cough and no shortness of breath Gastrointestinal:  no abdominal pain, no change in bowel habits GU:  Female: negative for dysuria or pelvic pain Musculoskeletal/Extremities:  no pain of the joints Integumentary (Skin/Breast):  no abnormal skin lesions reported Neurologic:  no headaches Endocrine:  denies fatigue Hematologic/Lymphatic:  No areas of easy bleeding  Exam BP 102/62 (BP Location: Left Arm, Patient Position: Sitting, Cuff Size: Normal)   Pulse 80   Temp 98.7 F (37.1 C) (Oral)   Ht 5\' 6"  (1.676 m)   Wt 144 lb 2 oz (65.4 kg)   SpO2 97%   BMI 23.26 kg/m  General:  well developed, well nourished, in no apparent distress Skin:  no significant moles, warts, or growths Head:  no masses, lesions, or tenderness Eyes:  pupils equal and round, sclera anicteric without  injection Ears:  canals without lesions, TMs shiny without retraction, no obvious effusion, no erythema Nose:  nares patent, septum midline, mucosa normal, and no drainage or sinus tenderness Throat/Pharynx:  lips and gingiva without lesion; tongue and uvula midline; non-inflamed pharynx; no exudates or postnasal drainage Neck: neck supple without adenopathy, thyromegaly, or masses Lungs:  clear to auscultation, breath sounds equal bilaterally, no respiratory distress Cardio:  regular rate and rhythm, no bruits, no LE edema Abdomen:  abdomen soft, nontender; bowel sounds normal; no masses or organomegaly Genital: Defer to GYN Musculoskeletal:  symmetrical muscle groups noted without atrophy or deformity Extremities:  no clubbing, cyanosis, or edema, no deformities, no skin discoloration Neuro:  gait normal; deep tendon reflexes normal and symmetric Psych: well oriented with normal range of affect and appropriate judgment/insight  Assessment and Plan  Well adult exam - Plan: CBC, Lipid panel, Comprehensive metabolic panel  Screening breast examination - Plan: MM DIGITAL SCREENING BILATERAL  Screen for colon cancer - Plan: Ambulatory referral to Gastroenterology  Screening for HIV (human immunodeficiency virus) - Plan: HIV antibody  Encounter for hepatitis C screening test for low risk patient - Plan: Hepatitis C antibody   Well 62 y.o. female. Counseled on diet and exercise. Info for GYN across hall given. Colonoscopy and mammogram. Other orders as above. Call pharmacy regarding Shingrix. Follow up in 1 yr pending above. The patient voiced understanding and agreement to the plan.  [place], DO 05/03/18 9:47 AM

## 2018-05-03 NOTE — Patient Instructions (Addendum)
Call [facility] for [facility] at [facility] at [phone] for an appointment regarding your pap smear.  They are located at [address], [address], [place], [place], [address] (right across the hall from our office).  Sept-Oct, call our office for a nurse visit regarding your flu shot.  Call your pharmacy to see about the availability of the new shingles vaccine (Shingrix).  1-2 days to get the results of your labs back.   Keep active and eating cleanly.  If you do not hear anything about your referral in the next 1-2 weeks, call our office and ask for an update.

## 2018-05-03 NOTE — Progress Notes (Signed)
Pre visit review using our clinic review tool, if applicable. No additional management support is needed unless otherwise documented below in the visit note. 

## 2018-05-04 LAB — HIV ANTIBODY (ROUTINE TESTING W REFLEX): HIV 1&2 Ab, 4th Generation: NONREACTIVE

## 2018-05-04 LAB — HEPATITIS C ANTIBODY
Hepatitis C Ab: NONREACTIVE
SIGNAL TO CUT-OFF: 0.02 (ref <1.00)

## 2018-05-08 ENCOUNTER — Ambulatory Visit (HOSPITAL_BASED_OUTPATIENT_CLINIC_OR_DEPARTMENT_OTHER)
Admission: RE | Admit: 2018-05-08 | Discharge: 2018-05-08 | Disposition: A | Discharge status: Home / Self Care | Payer: Federal, State, Local not specified - PPO | Source: Ambulatory Visit | Attending: Family Medicine | Admitting: Family Medicine

## 2018-05-08 DIAGNOSIS — Z1239 Encounter for other screening for malignant neoplasm of breast: Secondary | ICD-10-CM

## 2018-05-08 DIAGNOSIS — Z1231 Encounter for screening mammogram for malignant neoplasm of breast: Secondary | ICD-10-CM | POA: Insufficient documentation

## 2018-05-31 ENCOUNTER — Encounter: Payer: Self-pay | Admitting: Obstetrics and Gynecology

## 2018-05-31 ENCOUNTER — Ambulatory Visit (INDEPENDENT_AMBULATORY_CARE_PROVIDER_SITE_OTHER): Payer: Federal, State, Local not specified - PPO | Admitting: Obstetrics and Gynecology

## 2018-05-31 VITALS — BP 114/72 | HR 79 | Ht 66.0 in | Wt 147.0 lb

## 2018-05-31 DIAGNOSIS — Z1151 Encounter for screening for human papillomavirus (HPV): Secondary | ICD-10-CM

## 2018-05-31 DIAGNOSIS — Z124 Encounter for screening for malignant neoplasm of cervix: Secondary | ICD-10-CM | POA: Diagnosis not present

## 2018-05-31 DIAGNOSIS — Z01419 Encounter for gynecological examination (general) (routine) without abnormal findings: Secondary | ICD-10-CM | POA: Diagnosis not present

## 2018-05-31 NOTE — Progress Notes (Signed)
Subjective:     Alyssa Cross is a 62 y.o. female G4P4 postmenopausal with BMI 23 who is here for a comprehensive physical exam. The patient reports no problems. She is not sexually active. She denies any episodes of vaginal bleeding since entering menopause 10 years ago. She denies urinary incontinence. She denies pelvic pain or abnormal discharge. 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  Past Medical History:  Diagnosis Date  . No known health problems    Past Surgical History:  Procedure Laterality Date  . TONSILLECTOMY  62 yrs old  . WISDOM TOOTH EXTRACTION     Family History  Problem Relation Age of Onset  . Diabetes Mother        type 2  . Heart disease Mother   . Heart failure Father   . Cancer Father        prostate  . Hypertension Sister     Social History   Socioeconomic History  . Marital status: Married    Spouse name: Not on file  . Number of children: Not on file  . Years of education: Not on file  . Highest education level: Not on file  Occupational History  . Not on file  Social Needs  . Financial resource strain: Not on file  . Food insecurity:    Worry: Not on file    Inability: Not on file  . Transportation needs:    Medical: Not on file    Non-medical: Not on file  Tobacco Use  . Smoking status: Former Smoker    Packs/day: 0.50    Years: 5.00    Pack years: 2.50    Types: Cigarettes    Last attempt to quit: 12/26/1973    Years since quitting: 44.4  . Smokeless tobacco: Never Used  Substance and Sexual Activity  . Alcohol use: No    Alcohol/week: 0.0 oz  . Drug use: No  . Sexual activity: Not Currently    Comment: lives with husband , 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[employer], dietary restrictions  Lifestyle  . Physical activity:    Days per week: Not on file    Minutes per session: Not on file  . Stress: Not on file  Relationships  . Social  connections:    Talks on phone: Not on file    Gets together: Not on file    Attends religious service: Not on file    Active member of club or organization: Not on file    Attends meetings of clubs or organizations: Not on file    Relationship status: Not on file  . Intimate partner violence:    Fear of current or ex partner: Not on file    Emotionally abused: Not on file    Physically abused: Not on file    Forced sexual activity: Not on file  Other Topics Concern  . Not on file  Social History Narrative  . Not on file   Health Maintenance  Topic Date Due  . PAP SMEAR  01/24/2011  . COLONOSCOPY  12/27/2015  . INFLUENZA VACCINE  07/26/2018  . MAMMOGRAM  05/08/2020  . TETANUS/TDAP  11/04/2024  . Hepatitis C Screening  Completed  . HIV Screening  Completed       Review of Systems Pertinent items are noted in HPI.   Objective:  Blood pressure 114/72, pulse 79, height 5\' 6"  (1.676  m), weight 147 lb (66.7 kg).     GENERAL: Well-developed, well-nourished female in no acute distress.  HEENT: Normocephalic, atraumatic. Sclerae anicteric.  NECK: Supple. Normal thyroid.  LUNGS: Clear to auscultation bilaterally.  HEART: Regular rate and rhythm. BREASTS: Symmetric in size. No palpable masses or lymphadenopathy, skin changes, or nipple drainage. ABDOMEN: Soft, nontender, nondistended. No organomegaly. PELVIC: Normal external female genitalia. Vagina is pink and rugated.  Normal discharge. Normal appearing cervix. Uterus is normal in size. No adnexal mass or tenderness. EXTREMITIES: No cyanosis, clubbing, or edema, 2+ distal pulses.    Assessment:    Healthy female exam.      Plan:    Pap smear collected Patient with normal screening mammogram 04/2018 Normal labs with PCP in May Patient will be contacted with abnormal results RTC in 1 year or prn See After Visit Summary for Counseling Recommendations

## 2018-06-01 LAB — CYTOLOGY - PAP
Diagnosis: NEGATIVE
HPV: NOT DETECTED

## 2018-06-04 ENCOUNTER — Encounter: Payer: Self-pay | Admitting: Family Medicine

## 2019-05-09 ENCOUNTER — Encounter: Payer: Federal, State, Local not specified - PPO | Admitting: Family Medicine

## 2019-06-25 ENCOUNTER — Encounter: Payer: Self-pay | Admitting: Family Medicine

## 2019-06-25 ENCOUNTER — Other Ambulatory Visit: Payer: Self-pay

## 2019-06-25 ENCOUNTER — Ambulatory Visit (INDEPENDENT_AMBULATORY_CARE_PROVIDER_SITE_OTHER): Payer: Federal, State, Local not specified - PPO | Admitting: Family Medicine

## 2019-06-25 VITALS — BP 120/82 | HR 83 | Temp 98.1°F | Resp 16 | Ht 66.0 in | Wt 149.0 lb

## 2019-06-25 DIAGNOSIS — Z0000 Encounter for general adult medical examination without abnormal findings: Secondary | ICD-10-CM | POA: Diagnosis not present

## 2019-06-25 DIAGNOSIS — Z1211 Encounter for screening for malignant neoplasm of colon: Secondary | ICD-10-CM

## 2019-06-25 LAB — COMPREHENSIVE METABOLIC PANEL
ALT: 16 U/L (ref 0–35)
AST: 16 U/L (ref 0–37)
Albumin: 4.2 g/dL (ref 3.5–5.2)
Alkaline Phosphatase: 63 U/L (ref 39–117)
BUN: 13 mg/dL (ref 6–23)
CO2: 29 mEq/L (ref 19–32)
Calcium: 9.2 mg/dL (ref 8.4–10.5)
Chloride: 105 mEq/L (ref 96–112)
Creatinine, Ser: 0.8 mg/dL (ref 0.40–1.20)
GFR: 72.07 mL/min (ref >60.00)
Glucose, Bld: 87 mg/dL (ref 70–99)
Potassium: 4.2 mEq/L (ref 3.5–5.1)
Sodium: 141 mEq/L (ref 135–145)
Total Bilirubin: 0.4 mg/dL (ref 0.2–1.2)
Total Protein: 6.3 g/dL (ref 6.0–8.3)

## 2019-06-25 LAB — LIPID PANEL
Cholesterol: 215 mg/dL — ABNORMAL HIGH (ref 0–200)
HDL: 54.8 mg/dL (ref >39.00)
LDL Cholesterol: 131 mg/dL — ABNORMAL HIGH (ref 0–99)
NonHDL: 160.16
Total CHOL/HDL Ratio: 4
Triglycerides: 145 mg/dL (ref 0.0–149.0)
VLDL: 29 mg/dL (ref 0.0–40.0)

## 2019-06-25 LAB — CBC
HCT: 41.1 % (ref 36.0–46.0)
Hemoglobin: 14 g/dL (ref 12.0–15.0)
MCHC: 34.2 g/dL (ref 30.0–36.0)
MCV: 87.1 fl (ref 78.0–100.0)
Platelets: 257 10*3/uL (ref 150.0–400.0)
RBC: 4.72 Mil/uL (ref 3.87–5.11)
RDW: 12.8 % (ref 11.5–15.5)
WBC: 6.8 10*3/uL (ref 4.0–10.5)

## 2019-06-25 NOTE — Patient Instructions (Addendum)
The new Shingrix vaccine (for shingles) is a 2 shot series. It can make people feel low energy, achy and almost like they have the flu for 48 hours after injection. Please plan accordingly when deciding on when to get this shot. Call our office for a nurse visit appointment to get this. The second shot of the series is less severe regarding the side effects, but it still lasts 48 hours.   If you do not hear anything about your referral in the next 1-2 weeks, call our office and ask for an update.  Give [place] 2-3 business days to get the results of your labs back.   Consider Compound W and duct tape for the area on your finger.   Keep the diet clean and stay active.  Let us know if you need anything.

## 2019-06-25 NOTE — Progress Notes (Signed)
Chief Complaint  Patient presents with  . Annual Exam     Well Woman Alyssa Cross is here for a complete physical.   Her last physical was >1 year ago.  Current diet: in general, a "healthy" diet. Current exercise: some walking, playing with grandson. Weight is steadily increasing and she denies daytime fatigue. No consistent snoring, waking up gasping for air No LMP recorded. Patient is postmenopausal. Seatbelt? Yes  Health Maintenance Pap/HPV- Yes Mammogram- Due Colonoscopy: Due Tetanus- Yes Hep C screening- Yes HIV screening- Yes  Past Medical History:  Diagnosis Date  . No known health problems      Past Surgical History:  Procedure Laterality Date  . TONSILLECTOMY  63 yrs old  . WISDOM TOOTH EXTRACTION      Medications  Current Outpatient Medications on File Prior to Visit  Medication Sig Dispense Refill  . diphenhydrAMINE (BENADRYL ALLERGY) 25 mg capsule      Allergies No Known Allergies  Review of Systems: Constitutional:  no unexpected weight changes Eye:  no recent significant change in vision Ear/Nose/Mouth/Throat:  Ears:  no tinnitus or vertigo and no recent change in hearing Nose/Mouth/Throat:  no complaints of nasal congestion, no sore throat Cardiovascular: no chest pain Respiratory:  no cough and no shortness of breath Gastrointestinal:  no abdominal pain, no change in bowel habits GU:  Female: negative for dysuria or pelvic pain Musculoskeletal/Extremities:  no pain of the joints Integumentary (Skin/Breast): +lesion on finger; no abnormal skin lesions reported Neurologic:  no headaches Endocrine:  denies fatigue Hematologic/Lymphatic:  No areas of easy bleeding  Exam BP 120/82 (BP Location: Left Arm, Patient Position: Sitting, Cuff Size: Normal)   Pulse 83   Temp 98.1 F (36.7 C) (Oral)   Resp 16   Ht 5\' 6"  (1.676 m)   Wt 149 lb (67.6 kg)   SpO2 97%   BMI 24.05 kg/m  General:  well developed, well nourished, in no apparent  distress Skin: on palmar surface of L 3rd digit, middle phalanx, there is a raised, dome-shaped and flesh colored lesion, no hyperkeratinization, no ttp or fluctuance; otherwise no significant moles, warts, or growths Head:  no masses, lesions, or tenderness Eyes:  pupils equal and round, sclera anicteric without injection Ears:  canals without lesions, TMs shiny without retraction, no obvious effusion, no erythema Nose:  nares patent, septum midline, mucosa normal, and no drainage or sinus tenderness Throat/Pharynx:  lips and gingiva without lesion; tongue and uvula midline; non-inflamed pharynx; no exudates or postnasal drainage Neck: neck supple without adenopathy, thyromegaly, or masses Lungs:  clear to auscultation, breath sounds equal bilaterally, no respiratory distress Cardio:  regular rate and rhythm, no bruits, no LE edema Abdomen:  abdomen soft, nontender; bowel sounds normal; no masses or organomegaly Genital: Defer to GYN Musculoskeletal:  symmetrical muscle groups noted without atrophy or deformity Extremities:  no clubbing, cyanosis, or edema, no deformities, no skin discoloration Neuro:  gait normal; deep tendon reflexes normal and symmetric Psych: well oriented with normal range of affect and appropriate judgment/insight  Assessment and Plan  Well adult exam - Plan: CBC, Comprehensive metabolic panel, Lipid panel  Screen for colon cancer - Plan: Ambulatory referral to Gastroenterology   Well 63 y.o. female. Counseled on diet and exercise. May have a wart. Compound W and duct tape rec'd.  Can biopsy or refer to derm if no improvement.  Other orders as above. Follow up in 1 yr or prn. The patient voiced understanding and agreement to the plan.  [place], DO 06/25/19 10:01 AM

## 2019-06-26 ENCOUNTER — Encounter: Payer: Self-pay | Admitting: Family Medicine

## 2019-07-02 ENCOUNTER — Other Ambulatory Visit (HOSPITAL_BASED_OUTPATIENT_CLINIC_OR_DEPARTMENT_OTHER): Payer: Self-pay | Admitting: Family Medicine

## 2019-07-02 DIAGNOSIS — Z1231 Encounter for screening mammogram for malignant neoplasm of breast: Secondary | ICD-10-CM

## 2019-07-04 ENCOUNTER — Ambulatory Visit (HOSPITAL_BASED_OUTPATIENT_CLINIC_OR_DEPARTMENT_OTHER)
Admission: RE | Admit: 2019-07-04 | Discharge: 2019-07-04 | Disposition: A | Discharge status: Home / Self Care | Payer: Federal, State, Local not specified - PPO | Source: Ambulatory Visit | Attending: Family Medicine | Admitting: Family Medicine

## 2019-07-04 ENCOUNTER — Other Ambulatory Visit: Payer: Self-pay

## 2019-07-04 DIAGNOSIS — Z1231 Encounter for screening mammogram for malignant neoplasm of breast: Secondary | ICD-10-CM | POA: Diagnosis present

## 2019-07-05 ENCOUNTER — Other Ambulatory Visit: Payer: Self-pay | Admitting: Family Medicine

## 2019-07-05 DIAGNOSIS — R928 Other abnormal and inconclusive findings on diagnostic imaging of breast: Secondary | ICD-10-CM

## 2019-07-11 ENCOUNTER — Other Ambulatory Visit: Payer: Self-pay

## 2019-07-11 ENCOUNTER — Ambulatory Visit
Admission: RE | Admit: 2019-07-11 | Discharge: 2019-07-11 | Disposition: A | Discharge status: Home / Self Care | Payer: Federal, State, Local not specified - PPO | Source: Ambulatory Visit | Attending: Family Medicine | Admitting: Family Medicine

## 2019-07-11 ENCOUNTER — Ambulatory Visit: Payer: Federal, State, Local not specified - PPO

## 2019-07-11 DIAGNOSIS — R928 Other abnormal and inconclusive findings on diagnostic imaging of breast: Secondary | ICD-10-CM

## 2019-09-19 ENCOUNTER — Encounter: Payer: Self-pay | Admitting: Internal Medicine

## 2020-06-26 ENCOUNTER — Encounter: Payer: Self-pay | Admitting: Family Medicine

## 2020-06-26 ENCOUNTER — Ambulatory Visit (INDEPENDENT_AMBULATORY_CARE_PROVIDER_SITE_OTHER): Payer: Medicare Other | Admitting: Family Medicine

## 2020-06-26 ENCOUNTER — Other Ambulatory Visit: Payer: Self-pay

## 2020-06-26 VITALS — BP 108/72 | HR 86 | Temp 98.2°F | Ht 66.0 in | Wt 149.4 lb

## 2020-06-26 DIAGNOSIS — Z1211 Encounter for screening for malignant neoplasm of colon: Secondary | ICD-10-CM | POA: Diagnosis not present

## 2020-06-26 DIAGNOSIS — E2839 Other primary ovarian failure: Secondary | ICD-10-CM

## 2020-06-26 DIAGNOSIS — Z23 Encounter for immunization: Secondary | ICD-10-CM | POA: Diagnosis not present

## 2020-06-26 DIAGNOSIS — Z0000 Encounter for general adult medical examination without abnormal findings: Secondary | ICD-10-CM

## 2020-06-26 LAB — CBC
HCT: 42.9 % (ref 36.0–46.0)
Hemoglobin: 14.5 g/dL (ref 12.0–15.0)
MCHC: 33.8 g/dL (ref 30.0–36.0)
MCV: 87 fl (ref 78.0–100.0)
Platelets: 256 10*3/uL (ref 150.0–400.0)
RBC: 4.93 Mil/uL (ref 3.87–5.11)
RDW: 13.1 % (ref 11.5–15.5)
WBC: 6.6 10*3/uL (ref 4.0–10.5)

## 2020-06-26 LAB — COMPREHENSIVE METABOLIC PANEL
ALT: 22 U/L (ref 0–35)
AST: 19 U/L (ref 0–37)
Albumin: 4.5 g/dL (ref 3.5–5.2)
Alkaline Phosphatase: 64 U/L (ref 39–117)
BUN: 16 mg/dL (ref 6–23)
CO2: 31 mEq/L (ref 19–32)
Calcium: 9.6 mg/dL (ref 8.4–10.5)
Chloride: 104 mEq/L (ref 96–112)
Creatinine, Ser: 0.75 mg/dL (ref 0.40–1.20)
GFR: 77.4 mL/min (ref >60.00)
Glucose, Bld: 96 mg/dL (ref 70–99)
Potassium: 4 mEq/L (ref 3.5–5.1)
Sodium: 143 mEq/L (ref 135–145)
Total Bilirubin: 0.5 mg/dL (ref 0.2–1.2)
Total Protein: 6.7 g/dL (ref 6.0–8.3)

## 2020-06-26 LAB — LIPID PANEL
Cholesterol: 220 mg/dL — ABNORMAL HIGH (ref 0–200)
HDL: 55.5 mg/dL (ref >39.00)
LDL Cholesterol: 128 mg/dL — ABNORMAL HIGH (ref 0–99)
NonHDL: 164.66
Total CHOL/HDL Ratio: 4
Triglycerides: 183 mg/dL — ABNORMAL HIGH (ref 0.0–149.0)
VLDL: 36.6 mg/dL (ref 0.0–40.0)

## 2020-06-26 LAB — TSH: TSH: 1.09 u[IU]/mL (ref 0.35–4.50)

## 2020-06-26 LAB — VITAMIN B12: Vitamin B-12: 335 pg/mL (ref 211–911)

## 2020-06-26 LAB — HEMOGLOBIN A1C: Hgb A1c MFr Bld: 5.6 % (ref 4.6–6.5)

## 2020-06-26 LAB — VITAMIN D 25 HYDROXY (VIT D DEFICIENCY, FRACTURES): VITD: 31.69 ng/mL (ref 30.00–100.00)

## 2020-06-26 LAB — MAGNESIUM: Magnesium: 2.2 mg/dL (ref 1.5–2.5)

## 2020-06-26 NOTE — Progress Notes (Signed)
Chief Complaint  Patient presents with  . Annual Exam     Well Woman Alyssa Cross is here for a complete physical.   Her last physical was >1 year ago.  Current diet: in general, diet is fair. Current exercise: playing with grandchild. Weight is stable and she denies daytime fatigue. Seatbelt? Yes   Health Maintenance Colonoscopy- No Shingrix- No DEXA- No Mammogram- Yes Tetanus- Yes Pneumonia- No Hep C screen- Yes  Past Medical History:  Diagnosis Date  . No known health problems      Past Surgical History:  Procedure Laterality Date  . TONSILLECTOMY  64 yrs old  . WISDOM TOOTH EXTRACTION     Medications  Current Outpatient Medications on File Prior to Visit  Medication Sig Dispense Refill  . diphenhydrAMINE (BENADRYL ALLERGY) 25 mg capsule      Allergies No Known Allergies  Review of Systems: Constitutional:  no fevers Eye:  no recent significant change in vision Ears:  No changes in hearing Nose/Mouth/Throat:  no complaints of nasal congestion, no sore throat Cardiovascular: no chest pain Respiratory:  No shortness of breath Gastrointestinal:  No change in bowel habits GU:  Female: negative for dysuria Integumentary:  no abnormal skin lesions reported Neurologic:  +paresthesias of toes Endocrine:  denies unexplained weight changes  Exam BP 108/72 (BP Location: Left Arm, Patient Position: Sitting, Cuff Size: Normal)   Pulse 86   Temp 98.2 F (36.8 C) (Temporal)   Ht 5\' 6"  (1.676 m)   Wt 149 lb 6 oz (67.8 kg)   SpO2 97%   BMI 24.11 kg/m  General:  well developed, well nourished, in no apparent distress Skin:  no significant moles, warts, or growths Head:  no masses, lesions, or tenderness Eyes:  pupils equal and round, sclera anicteric without injection Ears:  canals without lesions, TMs shiny without retraction, no obvious effusion, no erythema Nose:  nares patent, septum midline, mucosa normal, and no drainage or sinus tenderness Throat/Pharynx:   lips and gingiva without lesion; tongue and uvula midline; non-inflamed pharynx; no exudates or postnasal drainage Neck: neck supple without adenopathy, thyromegaly, or masses Lungs:  clear to auscultation, breath sounds equal bilaterally, no respiratory distress Cardio:  regular rate and rhythm, no bruits or LE edema Abdomen:  abdomen soft, nontender; bowel sounds normal; no masses or organomegaly Genital: Deferred Neuro:  gait normal; deep tendon reflexes normal and symmetric Psych: well oriented with normal range of affect and appropriate judgment/insight  Assessment and Plan  Well adult exam - Plan: CBC, Comprehensive metabolic panel, Lipid panel, B12, TSH, VITAMIN D 25 Hydroxy (Vit-D Deficiency, Fractures), Magnesium, Hemoglobin A1c  Screen for colon cancer - Plan: Ambulatory referral to Gastroenterology  Estrogen deficiency - Plan: DG Bone Density   Well 64 y.o. female. Counseled on diet and exercise. Other orders as above. Will ck a few extra labs for paresthesias, likely from spending time on feet taking care of grandson.  Refer GI for ccs. Imaging should reach out regarding upcoming mammogram. She will let me know if she does not hear anything. Ck Dexa. Rec'd Covid vaccine. PCV23 today.  Follow up in 1 yr or prn. The patient voiced understanding and agreement to the plan.  [place], DO 06/26/20 10:00 AM

## 2020-06-26 NOTE — Patient Instructions (Addendum)
Give [place] 2-3 business days to get the results of your labs back. If labs are normal and the tingling is bothersome enough, consider metatarsal pads for your footwear.  If you do not hear anything about your referral in the next 1-2 weeks, call our office and ask for an update.  Keep the diet clean and stay active.   You are up to date with your tetanus shot and are due again in November, 2025.   There is no current flu shot out.   I do recommend the [place] or Moderna vaccine for Covid.   The new Shingrix vaccine (for shingles) is a 2 shot series. It can make people feel low energy, achy and almost like they have the flu for 48 hours after injection. Please plan accordingly when deciding on when to get this shot. Call our office for a nurse visit appointment to get this. The second shot of the series is less severe regarding the side effects, but it still lasts 48 hours.    Let us know if you need anything.

## 2020-06-26 NOTE — Addendum Note (Signed)
Addended by: Sharon Seller B on: 06/26/2020 10:26 AM   Modules accepted: Orders

## 2020-07-03 ENCOUNTER — Encounter: Payer: Self-pay | Admitting: Gastroenterology

## 2020-07-03 ENCOUNTER — Other Ambulatory Visit: Payer: Self-pay

## 2020-07-03 ENCOUNTER — Ambulatory Visit (HOSPITAL_BASED_OUTPATIENT_CLINIC_OR_DEPARTMENT_OTHER)
Admission: RE | Admit: 2020-07-03 | Discharge: 2020-07-03 | Disposition: A | Discharge status: Home / Self Care | Payer: Medicare Other | Source: Ambulatory Visit | Attending: Family Medicine | Admitting: Family Medicine

## 2020-07-03 DIAGNOSIS — E2839 Other primary ovarian failure: Secondary | ICD-10-CM | POA: Diagnosis not present

## 2020-07-14 IMAGING — MG DIGITAL SCREENING BILATERAL MAMMOGRAM WITH TOMO AND CAD
8 series · 9 of 24 positions shown · non-contrast
Comparison: Previous exam(s).

CLINICAL DATA: Screening.

EXAM:
DIGITAL SCREENING BILATERAL MAMMOGRAM WITH TOMO AND CAD

[L CC synth-2D]
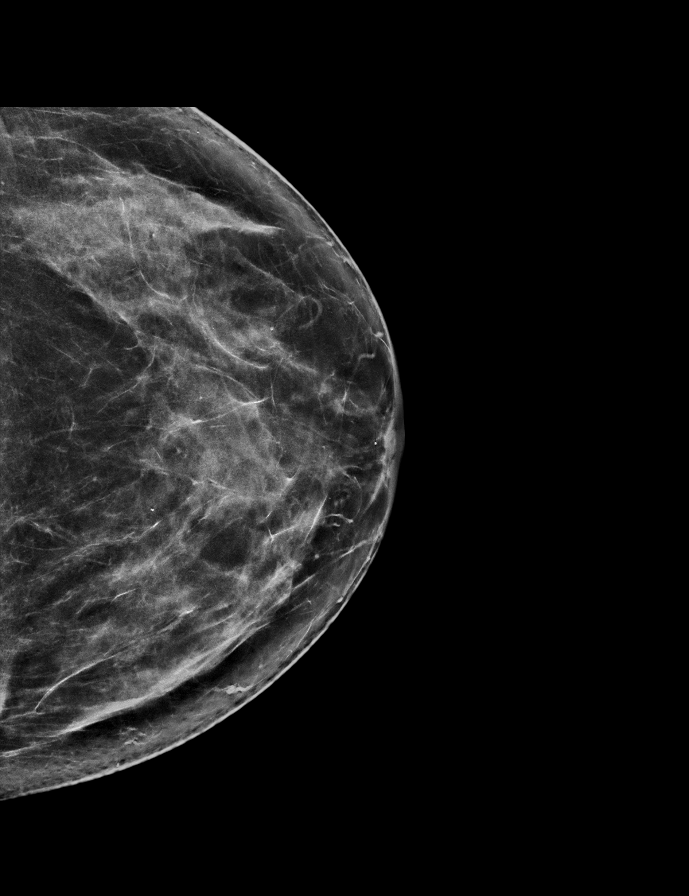

[R CC synth-2D]
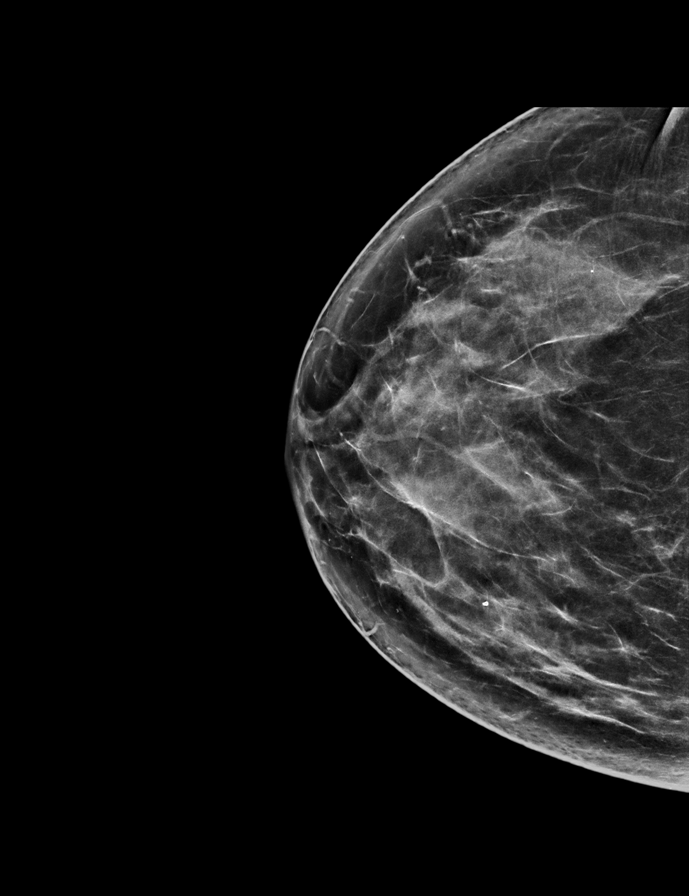

[R MLO synth-2D]
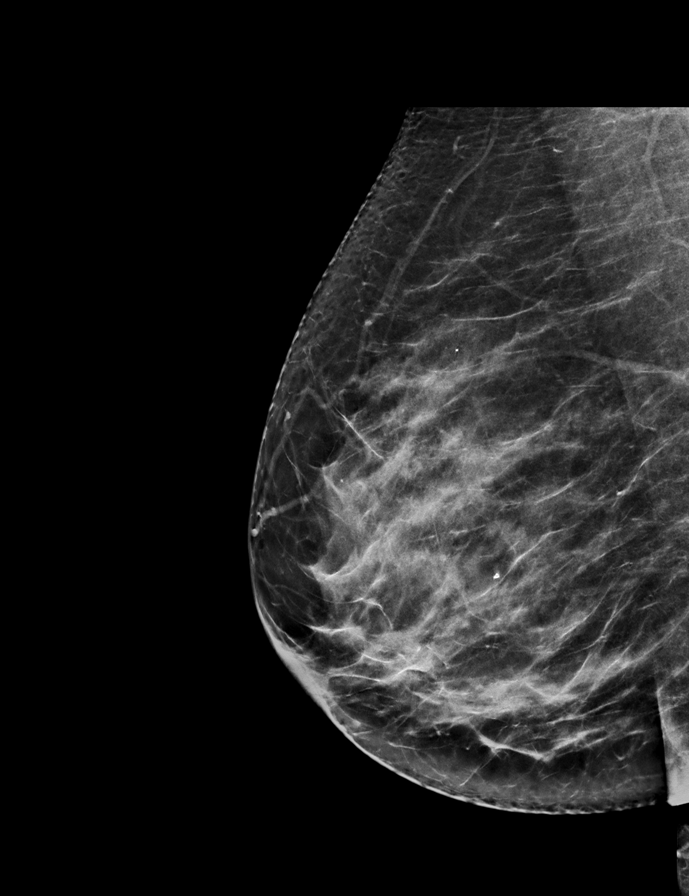

[L MLO synth-2D]
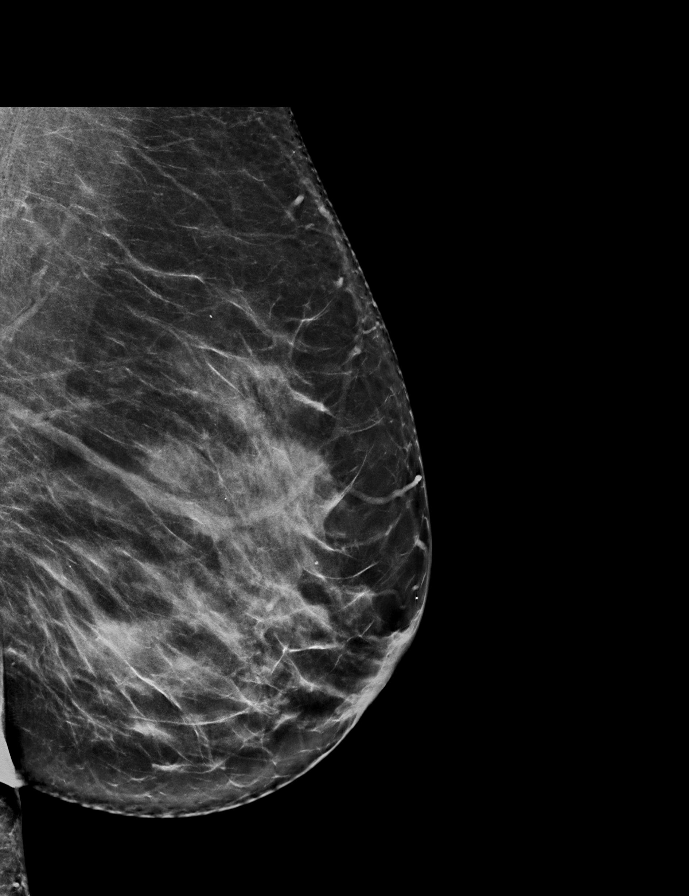

[R MLO tomo · 2 of 70 frames shown]
[frame 23/70]
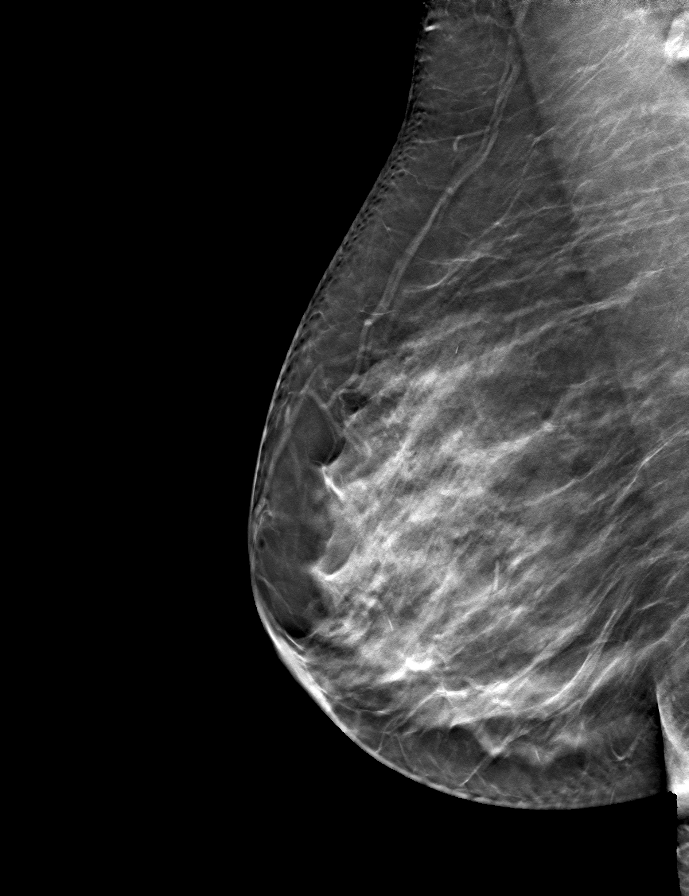
[frame 35/70]
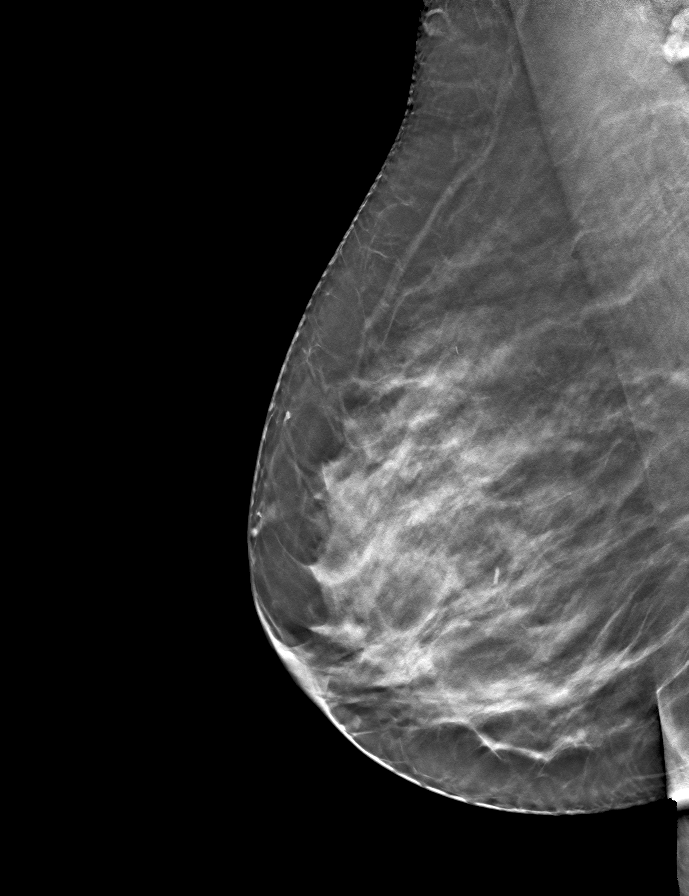

[L CC tomo · tomo slice 35/69.0]
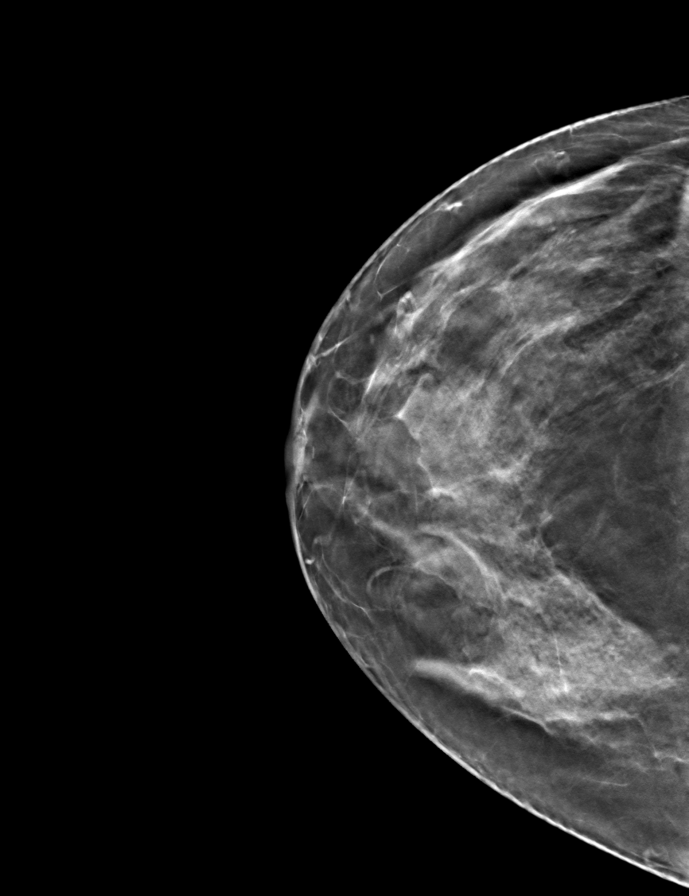

[R CC tomo · tomo slice 35/70.0]
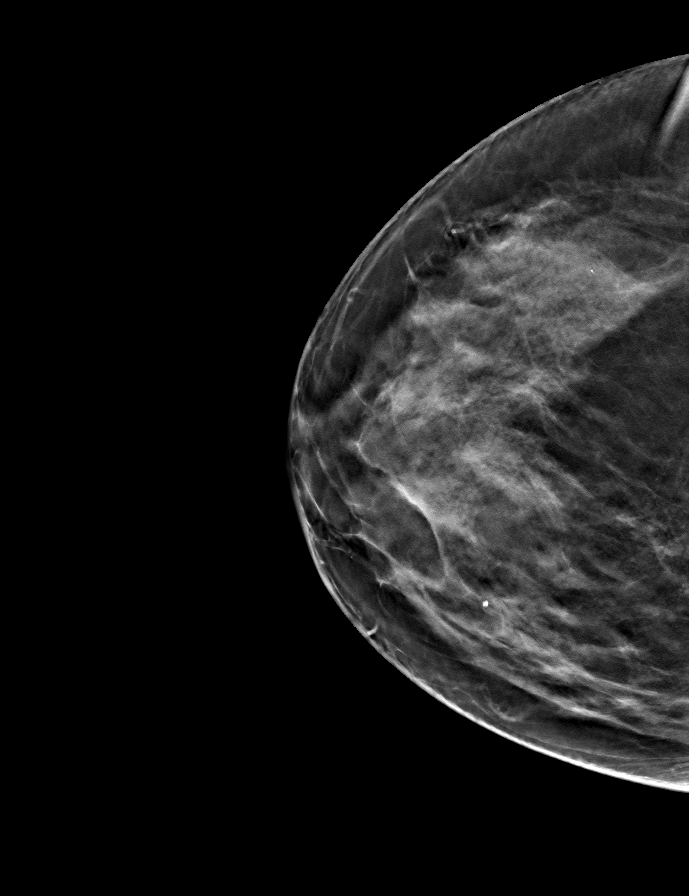

[L MLO tomo · tomo slice 37/73.0]
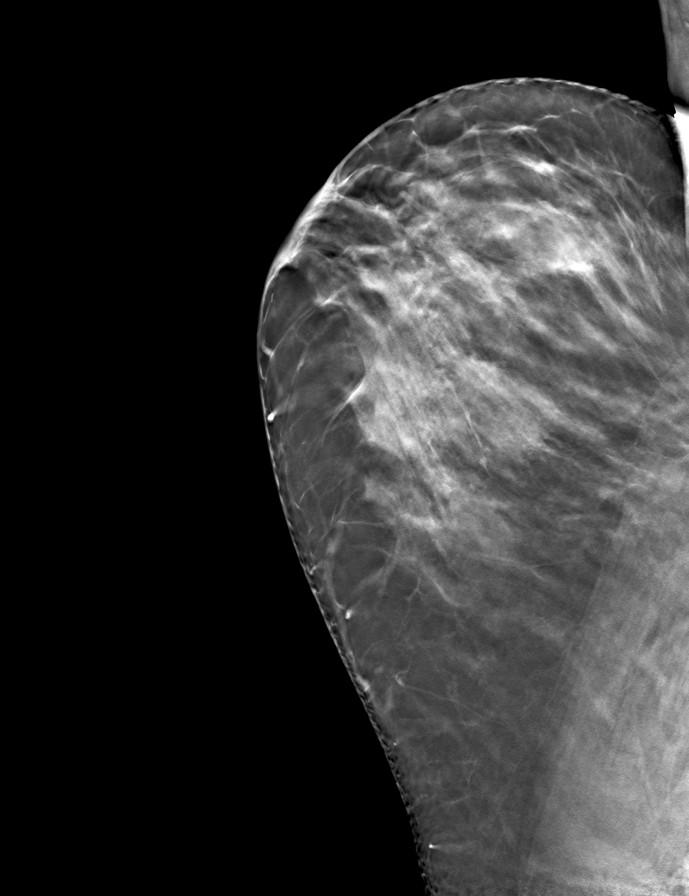

[9 of 24 positions shown; findings below may reference images not displayed]

ACR Breast Density Category d: The breast tissue is extremely dense,
which lowers the sensitivity of mammography.
FINDINGS: In the left breast, a possible asymmetry warrants further
evaluation. In the right breast, no findings suspicious for
malignancy. Images were processed with CAD.
IMPRESSION: Further evaluation is suggested for possible asymmetry in the left
breast.

RECOMMENDATION:
Diagnostic mammogram and possibly ultrasound of the left breast.
(Code:W0-2-002)

The patient will be contacted regarding the findings, and additional
imaging will be scheduled.

BI-RADS CATEGORY  0: Incomplete. Need additional imaging evaluation
and/or prior mammograms for comparison.

## 2020-08-14 ENCOUNTER — Ambulatory Visit (AMBULATORY_SURGERY_CENTER): Payer: Self-pay | Admitting: *Deleted

## 2020-08-14 ENCOUNTER — Other Ambulatory Visit: Payer: Self-pay

## 2020-08-14 VITALS — Ht 66.0 in | Wt 149.0 lb

## 2020-08-14 DIAGNOSIS — Z1211 Encounter for screening for malignant neoplasm of colon: Secondary | ICD-10-CM

## 2020-08-14 DIAGNOSIS — Z01818 Encounter for other preprocedural examination: Secondary | ICD-10-CM

## 2020-08-14 MED ORDER — SUPREP BOWEL PREP KIT 17.5-3.13-1.6 GM/177ML PO SOLN: 1.0000 | Freq: Once | ORAL | 0 refills | Status: AC

## 2020-08-14 NOTE — Progress Notes (Signed)
covid test 08-26-20 at 40  Pt is aware that care partner will wait in the car during procedure; if they feel like they will be too hot or cold to wait in the car; they may wait in the 4 th floor lobby. Patient is aware to bring only one care partner. We want them to wear a mask (we do not have any that we can provide them), practice social distancing, and we will check their temperatures when they get here.  I did remind the patient that their care partner needs to stay in the parking lot the entire time and have a cell phone available, we will call them when the pt is ready for discharge. Patient will wear mask into building.   No trouble with anesthesia, difficulty with moving neck, or hx/fam hx of malignant hyperthermia per pt   No egg or soy allergy  No home oxygen use   No medications for weight loss taken  Pt denies constipation issues  Suprep coupon given and code put into RX

## 2020-08-20 ENCOUNTER — Other Ambulatory Visit: Payer: Self-pay | Admitting: Family Medicine

## 2020-08-20 DIAGNOSIS — Z1231 Encounter for screening mammogram for malignant neoplasm of breast: Secondary | ICD-10-CM

## 2020-08-26 ENCOUNTER — Other Ambulatory Visit: Payer: Self-pay | Admitting: Gastroenterology

## 2020-08-26 ENCOUNTER — Ambulatory Visit (INDEPENDENT_AMBULATORY_CARE_PROVIDER_SITE_OTHER): Payer: Federal, State, Local not specified - PPO

## 2020-08-26 DIAGNOSIS — Z1159 Encounter for screening for other viral diseases: Secondary | ICD-10-CM

## 2020-08-26 LAB — SARS CORONAVIRUS 2 (TAT 6-24 HRS): SARS Coronavirus 2: NEGATIVE

## 2020-08-28 ENCOUNTER — Other Ambulatory Visit: Payer: Self-pay

## 2020-08-28 ENCOUNTER — Ambulatory Visit (AMBULATORY_SURGERY_CENTER): Payer: Medicare Other | Admitting: Gastroenterology

## 2020-08-28 ENCOUNTER — Encounter: Payer: Self-pay | Admitting: Gastroenterology

## 2020-08-28 VITALS — BP 128/81 | HR 65 | Temp 99.5°F | Resp 16 | Ht 66.0 in | Wt 149.0 lb

## 2020-08-28 DIAGNOSIS — K640 First degree hemorrhoids: Secondary | ICD-10-CM

## 2020-08-28 DIAGNOSIS — Z1211 Encounter for screening for malignant neoplasm of colon: Secondary | ICD-10-CM

## 2020-08-28 DIAGNOSIS — K635 Polyp of colon: Secondary | ICD-10-CM

## 2020-08-28 DIAGNOSIS — K621 Rectal polyp: Secondary | ICD-10-CM | POA: Diagnosis not present

## 2020-08-28 DIAGNOSIS — D128 Benign neoplasm of rectum: Secondary | ICD-10-CM

## 2020-08-28 DIAGNOSIS — D125 Benign neoplasm of sigmoid colon: Secondary | ICD-10-CM

## 2020-08-28 DIAGNOSIS — K5730 Diverticulosis of large intestine without perforation or abscess without bleeding: Secondary | ICD-10-CM

## 2020-08-28 MED ORDER — SODIUM CHLORIDE 0.9 % IV SOLN: 500.0000 mL | Freq: Once | INTRAVENOUS | Status: DC

## 2020-08-28 NOTE — Patient Instructions (Signed)
Handouts provided on polyps, diverticulosis and hemorrhoids.   Use fiber, for example Citrucel, Fibercon, Konsyl or Metamucil.   YOU HAD AN ENDOSCOPIC PROCEDURE TODAY AT THE [facility]:   Refer to the procedure report that was given to you for any specific questions about what was found during the examination.  If the procedure report does not answer your questions, please call your gastroenterologist to clarify.  If you requested that your care partner not be given the details of your procedure findings, then the procedure report has been included in a sealed envelope for you to review at your convenience later.  YOU SHOULD EXPECT: Some feelings of bloating in the abdomen. Passage of more gas than usual.  Walking can help get rid of the air that was put into your GI tract during the procedure and reduce the bloating. If you had a lower endoscopy (such as a colonoscopy or flexible sigmoidoscopy) you may notice spotting of blood in your stool or on the toilet paper. If you underwent a bowel prep for your procedure, you may not have a normal bowel movement for a few days.  Please Note:  You might notice some irritation and congestion in your nose or some drainage.  This is from the oxygen used during your procedure.  There is no need for concern and it should clear up in a day or so.  SYMPTOMS TO REPORT IMMEDIATELY:   Following lower endoscopy (colonoscopy or flexible sigmoidoscopy):  Excessive amounts of blood in the stool  Significant tenderness or worsening of abdominal pains  Swelling of the abdomen that is new, acute  Fever of 100F or higher  For urgent or emergent issues, a gastroenterologist can be reached at any hour by calling [phone]. Do not use MyChart messaging for urgent concerns.    DIET:  We do recommend a small meal at first, but then you may proceed to your regular diet.  Drink plenty of fluids but you should avoid alcoholic beverages for 24  hours.  ACTIVITY:  You should plan to take it easy for the rest of today and you should NOT DRIVE or use heavy machinery until tomorrow (because of the sedation medicines used during the test).    FOLLOW UP: Our staff will call the number listed on your records 48-72 hours following your procedure to check on you and address any questions or concerns that you may have regarding the information given to you following your procedure. If we do not reach you, we will leave a message.  We will attempt to reach you two times.  During this call, we will ask if you have developed any symptoms of COVID 19. If you develop any symptoms (ie: fever, flu-like symptoms, shortness of breath, cough etc.) before then, please call [phone].  If you test positive for Covid 19 in the 2 weeks post procedure, please call and report this information to us.    If any biopsies were taken you will be contacted by phone or by letter within the next 1-3 weeks.  Please call us at [phone] if you have not heard about the biopsies in 3 weeks.    SIGNATURES/CONFIDENTIALITY: You and/or your care partner have signed paperwork which will be entered into your electronic medical record.  These signatures attest to the fact that that the information above on your After Visit Summary has been reviewed and is understood.  Full responsibility of the confidentiality of this discharge information lies with you and/or your   care-partner.  

## 2020-08-28 NOTE — Progress Notes (Signed)
Pt's states no medical or surgical changes since previsit or office visit. 

## 2020-08-28 NOTE — Op Note (Signed)
[facility] Patient Name: Alyssa Cross Procedure Date: 08/28/2020 9:34 AM MRN: [id number] Endoscopist: Gerrit Heck , MD Age: 64 Referring MD:  Date of Birth: 01-03-1956 Gender: Female Account #: [id number] Procedure:                Colonoscopy Indications:              Screening for colorectal malignant neoplasm (last                            colonoscopy was more than 10 years ago) Medicines:                Monitored Anesthesia Care Procedure:                Pre-Anesthesia Assessment:                           - Prior to the procedure, a History and Physical                            was performed, and patient medications and                            allergies were reviewed. The patient's tolerance of                            previous anesthesia was also reviewed. The risks                            and benefits of the procedure and the sedation                            options and risks were discussed with the patient.                            All questions were answered, and informed consent                            was obtained. Prior Anticoagulants: The patient has                            taken no previous anticoagulant or antiplatelet                            agents. ASA Grade Assessment: I - A normal, healthy                            patient. After reviewing the risks and benefits,                            the patient was deemed in satisfactory condition to                            undergo the procedure.  After obtaining informed consent, the colonoscope                            was passed under direct vision. Throughout the                            procedure, the patient's blood pressure, pulse, and                            oxygen saturations were monitored continuously. The                            Colonoscope was introduced through the anus and                            advanced to the the cecum,  identified by                            appendiceal orifice and ileocecal valve. The                            colonoscopy was performed without difficulty. The                            patient tolerated the procedure well. The quality                            of the bowel preparation was excellent. The                            ileocecal valve, appendiceal orifice, and rectum                            were photographed. Scope In: 9:57:10 AM Scope Out: 10:12:47 AM Scope Withdrawal Time: 0 hours 12 minutes 6 seconds  Total Procedure Duration: 0 hours 15 minutes 37 seconds  Findings:                 The perianal and digital rectal examinations were                            normal.                           Four sessile polyps were found in the rectum (3)                            and distal sigmoid colon. The polyps were 1 to 2 mm                            in size. These polyps were removed with a cold                            biopsy forceps. Resection and retrieval were  complete. Estimated blood loss was minimal.                           A few small-mouthed diverticula were found in the                            sigmoid colon and ascending colon.                           Non-bleeding internal hemorrhoids were found during                            retroflexion. The hemorrhoids were small and Grade                            I (internal hemorrhoids that do not prolapse).                           Retroflexion in the right colon was performed. The                            remainder of the colon was otherwise normal. Complications:            No immediate complications. Estimated Blood Loss:     Estimated blood loss was minimal. Impression:               - Four 1 to 2 mm polyps in the rectum and in the                            distal sigmoid colon, removed with a cold biopsy                            forceps. Resected and retrieved.                            - Diverticulosis in the sigmoid colon and in the                            ascending colon.                           - Non-bleeding internal hemorrhoids. Recommendation:           - Patient has a contact number available for                            emergencies. The signs and symptoms of potential                            delayed complications were discussed with the                            patient. Return to normal activities tomorrow.  Written discharge instructions were provided to the                            patient.                           - Resume previous diet.                           - Continue present medications.                           - Await pathology results.                           - Repeat colonoscopy in 5-10 years for surveillance                            based on pathology results.                           - Use fiber, for example Citrucel, Fibercon, Konsyl                            or Metamucil.                           - Return to GI clinic PRN. Gerrit Heck, MD 08/28/2020 10:18:05 AM

## 2020-08-28 NOTE — Progress Notes (Signed)
Called to room to assist during endoscopic procedure.  Patient ID and intended procedure confirmed with present staff. Received instructions for my participation in the procedure from the performing physician.  

## 2020-08-28 NOTE — Progress Notes (Signed)
PT taken to PACU. Monitors in place. VSS. Report given to RN. 

## 2020-09-02 ENCOUNTER — Telehealth: Payer: Self-pay

## 2020-09-02 NOTE — Telephone Encounter (Signed)
  Follow up Call-  Call back number 08/28/2020  Post procedure Call Back phone  # [phone]  Permission to leave phone message Yes  Some recent data might be hidden     Patient questions:  Do you have a fever, pain , or abdominal swelling? No. Pain Score  0 *  Have you tolerated food without any problems? Yes.    Have you been able to return to your normal activities? Yes.    Do you have any questions about your discharge instructions: Diet   No. Medications  No. Follow up visit  No.  Do you have questions or concerns about your Care? No.  Actions: * If pain score is 4 or above: 1. No action needed, pain <4.Have you developed a fever since your procedure? no  2.   Have you had an respiratory symptoms (SOB or cough) since your procedure? no  3.   Have you tested positive for COVID 19 since your procedure no  4.   Have you had any family members/close contacts diagnosed with the COVID 19 since your procedure?  no   If yes to any of these questions please route to Joylene John, RN and Joella Prince, RN

## 2020-09-07 ENCOUNTER — Encounter: Payer: Self-pay | Admitting: Gastroenterology

## 2020-09-18 ENCOUNTER — Other Ambulatory Visit: Payer: Self-pay | Admitting: Family Medicine

## 2020-09-18 ENCOUNTER — Ambulatory Visit
Admission: RE | Admit: 2020-09-18 | Discharge: 2020-09-18 | Disposition: A | Discharge status: Home / Self Care | Payer: Federal, State, Local not specified - PPO | Source: Ambulatory Visit | Attending: Family Medicine | Admitting: Family Medicine

## 2020-09-18 ENCOUNTER — Other Ambulatory Visit: Payer: Self-pay

## 2020-09-18 DIAGNOSIS — Z1231 Encounter for screening mammogram for malignant neoplasm of breast: Secondary | ICD-10-CM

## 2020-09-18 DIAGNOSIS — N6452 Nipple discharge: Secondary | ICD-10-CM

## 2020-10-09 ENCOUNTER — Ambulatory Visit
Admission: RE | Admit: 2020-10-09 | Discharge: 2020-10-09 | Disposition: A | Discharge status: Home / Self Care | Payer: Federal, State, Local not specified - PPO | Source: Ambulatory Visit | Attending: Family Medicine | Admitting: Family Medicine

## 2020-10-09 ENCOUNTER — Other Ambulatory Visit: Payer: Self-pay

## 2020-10-09 ENCOUNTER — Ambulatory Visit: Payer: Federal, State, Local not specified - PPO

## 2020-10-09 DIAGNOSIS — N6452 Nipple discharge: Secondary | ICD-10-CM

## 2021-06-29 ENCOUNTER — Encounter: Payer: Federal, State, Local not specified - PPO | Admitting: Family Medicine

## 2021-09-17 ENCOUNTER — Other Ambulatory Visit: Payer: Self-pay

## 2021-09-17 ENCOUNTER — Encounter: Payer: Self-pay | Admitting: Family Medicine

## 2021-09-17 ENCOUNTER — Ambulatory Visit: Payer: Federal, State, Local not specified - PPO | Admitting: Family Medicine

## 2021-09-17 VITALS — BP 108/72 | HR 97 | Temp 97.9°F | Ht 66.0 in | Wt 143.0 lb

## 2021-09-17 DIAGNOSIS — R1013 Epigastric pain: Secondary | ICD-10-CM

## 2021-09-17 NOTE — Progress Notes (Signed)
Chief Complaint  Patient presents with   Abdominal Pain    Alyssa Cross is here for abdominal pain.  Duration: 1  week Nighttime awakenings? Yes Bleeding? No Weight loss? No Palliation: PPI, Pepcid Provocation: none Associated symptoms: poor appetite, vomiting, aches, coughing, belching Denies: fever, nausea, and diarrhea Granddaughter had a stomach bug Treatment to date: Pepcid, Prilosec  Past Medical History:  Diagnosis Date   Allergy    No known health problems     BP 108/72   Pulse 97   Temp 97.9 F (36.6 C) (Oral)   Ht 5\' 6"  (1.676 m)   Wt 143 lb (64.9 kg)   SpO2 96%   BMI 23.08 kg/m  Gen.: Awake, alert, appears stated age 65: Mucous membranes moist without mucosal lesions Heart: Regular rate and rhythm without murmurs Lungs: Clear auscultation bilaterally, no rales or wheezing, normal effort without accessory muscle use. Abdomen: Bowel sounds are present. Abdomen is soft, mild epigastric ttp, nondistended, no masses or organomegaly. Negative Murphy's, Rovsing's, McBurney's, and Carnett's sign. Psych: Age appropriate judgment and insight. Normal mood and affect.  Dyspepsia  Omeprazole 20 mg/d for 14 total days. OK to use Pepcid. Reflux precautions discussed. F/u for CPE or prn Pt voiced understanding and agreement to the plan.  [place], DO 09/17/21 1:31 PM

## 2021-09-17 NOTE — Patient Instructions (Addendum)
Continue Prilosec daily for 10 total days. OK to continue famotidine as needed. You could even use Tums. I don't think you will need this much longer though.   Let me know if you are having persistent symptoms.  The only lifestyle changes that have data behind them are weight loss for the overweight/obese and elevating the head of the bed. Finding out which foods/positions are triggers is important.  Let us know if you need anything.

## 2021-10-27 ENCOUNTER — Encounter: Payer: Self-pay | Admitting: Family Medicine

## 2021-10-27 ENCOUNTER — Other Ambulatory Visit: Payer: Self-pay

## 2021-10-27 ENCOUNTER — Ambulatory Visit (INDEPENDENT_AMBULATORY_CARE_PROVIDER_SITE_OTHER): Payer: Federal, State, Local not specified - PPO | Admitting: Family Medicine

## 2021-10-27 VITALS — BP 132/78 | HR 90 | Temp 98.2°F | Ht 66.0 in | Wt 142.4 lb

## 2021-10-27 DIAGNOSIS — R1013 Epigastric pain: Secondary | ICD-10-CM | POA: Diagnosis not present

## 2021-10-27 DIAGNOSIS — R7401 Elevation of levels of liver transaminase levels: Secondary | ICD-10-CM

## 2021-10-27 NOTE — Patient Instructions (Signed)
Please get the CT scan with the [facility] team and let's go from there. When you do get it done, send me a message the following day to update me.   Let me know if anything changes.

## 2021-10-27 NOTE — Progress Notes (Signed)
Chief Complaint  Patient presents with   Follow-up    Urgent care    Subjective: Patient is a 65 y.o. female here for UC f/u.  Around 3 days ago the patient started experiencing epigastric abdominal pain in addition to nausea.  She went to urgent care and had labs drawn.  She was later told to go to the hospital due to elevated lipase.  CT scan was unremarkable and the ED doc told her that things were normal and just to follow-up with GI.  She has her own GI doctor and surprised when she received a call from the [facility] gastroenterology team.  She went to the appointment and saw PA who told her she might need a HIDA scan or MRCP.  She is here to discuss what to do.  Her pain is better overall and she started to eat again.  She did have an episode of clay colored stool that did not smell normal.  She normally has severe pain after eating.  She is adhering to a low-fat diet.  No diarrhea, bleeding, fevers, nausea, or vomiting.   We reviewed records together and saw that the CT scan showed biliary sludge and stones.  It also showed cysts.  After the GI PA discussed her case with the physician covering, it was decided to get a repeat CT scan and refer to general surgery.  Past Medical History:  Diagnosis Date   Allergy    No known health problems     Objective: BP 132/78   Pulse 90   Temp 98.2 F (36.8 C) (Oral)   Ht 5\' 6"  (1.676 m)   Wt 142 lb 6 oz (64.6 kg)   SpO2 94%   BMI 22.98 kg/m  General: Awake, appears stated age Heart: RRR Lungs: CTAB, no rales, wheezes or rhonchi. No accessory muscle use Abdomen: Bowel sounds present, tender to palpation of the epigastric region, negative Murphy's, Rovsing's, McBurney's, Carnett's Psych: Age appropriate judgment and insight, normal affect and mood  Assessment and Plan: Transaminasemia  Epigastric abdominal pain  Discussed the above results obtained through urgent care and the ER.  We went over the CT scan results as well.  Decided  repeating the CT scan per the GI physicians recommendations is a reasonable step to go.  If that does not show anything and she is no longer having symptoms, I think she can avoid seeing the general surgeon.  If it does show anything significant or evidence of pancreatitis, may be worthwhile to get the consultation. The patient voiced understanding and agreement to the plan.  I spent 40 minutes with the patient discussing the above plan and reviewing her chart on the same day of the visit.  [place], DO 10/27/21  4:57 PM

## 2021-11-22 ENCOUNTER — Other Ambulatory Visit: Payer: Self-pay | Admitting: Family Medicine

## 2021-11-22 DIAGNOSIS — Z1231 Encounter for screening mammogram for malignant neoplasm of breast: Secondary | ICD-10-CM

## 2021-12-24 ENCOUNTER — Ambulatory Visit: Payer: Federal, State, Local not specified - PPO | Admitting: Internal Medicine

## 2021-12-24 ENCOUNTER — Encounter: Payer: Self-pay | Admitting: Internal Medicine

## 2021-12-24 VITALS — BP 126/82 | HR 89 | Temp 98.5°F | Resp 16 | Ht 66.0 in | Wt 149.0 lb

## 2021-12-24 DIAGNOSIS — M545 Low back pain, unspecified: Secondary | ICD-10-CM

## 2021-12-24 MED ORDER — PREDNISONE 10 MG PO TABS: ORAL_TABLET | ORAL | 0 refills | Status: DC

## 2021-12-24 MED ORDER — CYCLOBENZAPRINE HCL 10 MG PO TABS: 10.0000 mg | ORAL_TABLET | Freq: Every evening | ORAL | 0 refills | Status: DC | PRN

## 2021-12-24 NOTE — Progress Notes (Signed)
Subjective:    Patient ID: Alyssa Cross, female    DOB: 06-25-1956, 65 y.o.   MRN: [id number]  DOS:  12/24/2021 Type of visit - description: Acute  Symptoms started a week ago: Developed pain located at the low back, right side, no radiation. Increases with certain positions, better when she stands up.  Worse when she lays down. Denies any rash. Local ice pack seem to help.  No recent injury although a year ago fell down the stairs and developed tailbone pain.  Did not seek medical help then, did not have any hip or pain at the time.   Review of Systems No fever chills Lower abdominal pain No dysuria or gross hematuria  Past Medical History:  Diagnosis Date   Allergy    No known health problems     Past Surgical History:  Procedure Laterality Date   COLONOSCOPY     TONSILLECTOMY  65 yrs old   WISDOM TOOTH EXTRACTION      Allergies as of 12/24/2021   No Known Allergies      Medication List        Accurate as of December 24, 2021 11:59 PM. If you have any questions, ask your nurse or doctor.          CALCIUM PO Take 1,200 mg by mouth daily.   cyclobenzaprine 10 MG tablet Commonly known as: FLEXERIL Take 1 tablet (10 mg total) by mouth at bedtime as needed for muscle spasms. Started by: Kathlene November, MD   diphenhydrAMINE 25 mg capsule Commonly known as: BENADRYL daily.   diphenhydramine-acetaminophen 25-500 MG Tabs tablet Commonly known as: TYLENOL PM Take 1 tablet by mouth at bedtime as needed.   predniSONE 10 MG tablet Commonly known as: DELTASONE 4 tablets x 2 days, 3 tabs x 2 days, 2 tabs x 2 days, 1 tab x 2 days Started by: Kathlene November, MD   VITAMIN D3 PO Take 1,000 Units by mouth daily.           Objective:   Physical Exam BP 126/82 (BP Location: Left Arm, Patient Position: Sitting, Cuff Size: Small)    Pulse 89    Temp 98.5 F (36.9 C) (Oral)    Resp 16    Ht 5\' 6"  (1.676 m)    Wt 149 lb (67.6 kg)    SpO2 98%    BMI 24.05 kg/m   General:   Well developed, NAD, BMI noted. HEENT:  Normocephalic . Face symmetric, atraumatic MSK: No TTP at the lumbar spine or SI areas. Nontender at the trochanteric bursas. Lower extremities: no pretibial edema bilaterally  Skin: Not pale. Not jaundice Neurologic:  alert & oriented X3.  Speech normal, gait appropriate for age and unassisted. Motor and DTR symmetric.  Straight leg test negative. Psych--  Cognition and judgment appear intact.  Cooperative with normal attention span and concentration.  Behavior appropriate. No anxious or depressed appearing.      Assessment     65 year old female, PMH includes hyperlipidemia, on no medications, osteopenia, presents with  Acute lumbalgia: As described above, no red flag symptoms. Will treat with anti-inflammatories: Prednisone for few days (Benadryl at night if it causes insomnia) Tylenol Alternate heat and cold compresses. muscle relaxant at night.  Watch for excessive drowsiness. If she is not better needs to come back for consideration of further evaluation.      This visit occurred during the SARS-CoV-2 public health emergency.  Safety protocols were in place, including screening questions  prior to the visit, additional usage of staff PPE, and extensive cleaning of exam room while observing appropriate contact time as indicated for disinfecting solutions.

## 2021-12-24 NOTE — Patient Instructions (Signed)
Start prednisone for few days  Tylenol  500 mg OTC 2 tabs a day every 8 hours as needed for pain  Alternate heat or a cold compress as needed  Okay to take Flexeril, a muscle relaxant at night.  Watch for drowsiness  If you are not gradually better, you get worse, you have fever chills or a rash let us know.

## 2021-12-31 ENCOUNTER — Ambulatory Visit: Payer: Federal, State, Local not specified - PPO

## 2022-01-28 ENCOUNTER — Ambulatory Visit (INDEPENDENT_AMBULATORY_CARE_PROVIDER_SITE_OTHER): Payer: Federal, State, Local not specified - PPO

## 2022-01-28 VITALS — Ht 66.0 in | Wt 149.0 lb

## 2022-01-28 DIAGNOSIS — Z0000 Encounter for general adult medical examination without abnormal findings: Secondary | ICD-10-CM | POA: Diagnosis not present

## 2022-01-28 NOTE — Patient Instructions (Signed)
Alyssa Cross , Thank you for taking time to complete your Medicare Wellness Visit. I appreciate your ongoing commitment to your health goals. Please review the following plan we discussed and let me know if I can assist you in the future.   Screening recommendations/referrals: Colonoscopy: Completed 08/28/2020-Due 08/28/2025 Mammogram: Due-Per our conversation you will call to schedule. Bone Density: Completed 07/03/2020-Due 07/03/2022 Recommended yearly ophthalmology/optometry visit for glaucoma screening and checkup Recommended yearly dental visit for hygiene and checkup  Vaccinations: Influenza vaccine: Due-May obtain vaccine at our office or your local pharmacy. Pneumococcal vaccine: Due-May obtain vaccine at our office or your local pharmacy. Tdap vaccine: Up to date Shingles vaccine: Due-May obtain vaccine at your local pharmacy. Covid-19:Declined  Advanced directives: Information mailed today  Conditions/risks identified: See problem list  Next appointment: Follow up in one year for your annual wellness visit    Preventive Care 65 Years and Older, Female Preventive care refers to lifestyle choices and visits with your health care provider that can promote health and wellness. What does preventive care include? A yearly physical exam. This is also called an annual well check. Dental exams once or twice a year. Routine eye exams. Ask your health care provider how often you should have your eyes checked. Personal lifestyle choices, including: Daily care of your teeth and gums. Regular physical activity. Eating a healthy diet. Avoiding tobacco and drug use. Limiting alcohol use. Practicing safe sex. Taking low-dose aspirin every day. Taking vitamin and mineral supplements as recommended by your health care provider. What happens during an annual well check? The services and screenings done by your health care provider during your annual well check will depend on your age, overall  health, lifestyle risk factors, and family history of disease. Counseling  Your health care provider may ask you questions about your: Alcohol use. Tobacco use. Drug use. Emotional well-being. Home and relationship well-being. Sexual activity. Eating habits. History of falls. Memory and ability to understand (cognition). Work and work Statistician. Reproductive health. Screening  You may have the following tests or measurements: Height, weight, and BMI. Blood pressure. Lipid and cholesterol levels. These may be checked every 5 years, or more frequently if you are over 76 years old. Skin check. Lung cancer screening. You may have this screening every year starting at age 59 if you have a 30-pack-year history of smoking and currently smoke or have quit within the past 15 years. Fecal occult blood test (FOBT) of the stool. You may have this test every year starting at age 77. Flexible sigmoidoscopy or colonoscopy. You may have a sigmoidoscopy every 5 years or a colonoscopy every 10 years starting at age 4. Hepatitis C blood test. Hepatitis B blood test. Sexually transmitted disease (STD) testing. Diabetes screening. This is done by checking your blood sugar (glucose) after you have not eaten for a while (fasting). You may have this done every 1-3 years. Bone density scan. This is done to screen for osteoporosis. You may have this done starting at age 25. Mammogram. This may be done every 1-2 years. Talk to your health care provider about how often you should have regular mammograms. Talk with your health care provider about your test results, treatment options, and if necessary, the need for more tests. Vaccines  Your health care provider may recommend certain vaccines, such as: Influenza vaccine. This is recommended every year. Tetanus, diphtheria, and acellular pertussis (Tdap, Td) vaccine. You may need a Td booster every 10 years. Zoster vaccine. You may need this  after age  59. Pneumococcal 13-valent conjugate (PCV13) vaccine. One dose is recommended after age 48. Pneumococcal polysaccharide (PPSV23) vaccine. One dose is recommended after age 23. Talk to your health care provider about which screenings and vaccines you need and how often you need them. This information is not intended to replace advice given to you by your health care provider. Make sure you discuss any questions you have with your health care provider. Document Released: 01/08/2016 Document Revised: 08/31/2016 Document Reviewed: 10/13/2015 Elsevier Interactive Patient Education  2017 [place] Prevention in the Home Falls can cause injuries. They can happen to people of all ages. There are many things you can do to make your home safe and to help prevent falls. What can I do on the outside of my home? Regularly fix the edges of walkways and driveways and fix any cracks. Remove anything that might make you trip as you walk through a door, such as a raised step or threshold. Trim any bushes or trees on the path to your home. Use bright outdoor lighting. Clear any walking paths of anything that might make someone trip, such as rocks or tools. Regularly check to see if handrails are loose or broken. Make sure that both sides of any steps have handrails. Any raised decks and porches should have guardrails on the edges. Have any leaves, snow, or ice cleared regularly. Use sand or salt on walking paths during winter. Clean up any spills in your garage right away. This includes oil or grease spills. What can I do in the bathroom? Use night lights. Install grab bars by the toilet and in the tub and shower. Do not use towel bars as grab bars. Use non-skid mats or decals in the tub or shower. If you need to sit down in the shower, use a plastic, non-slip stool. Keep the floor dry. Clean up any water that spills on the floor as soon as it happens. Remove soap buildup in the tub or shower  regularly. Attach bath mats securely with double-sided non-slip rug tape. Do not have throw rugs and other things on the floor that can make you trip. What can I do in the bedroom? Use night lights. Make sure that you have a light by your bed that is easy to reach. Do not use any sheets or blankets that are too big for your bed. They should not hang down onto the floor. Have a firm chair that has side arms. You can use this for support while you get dressed. Do not have throw rugs and other things on the floor that can make you trip. What can I do in the kitchen? Clean up any spills right away. Avoid walking on wet floors. Keep items that you use a lot in easy-to-reach places. If you need to reach something above you, use a strong step stool that has a grab bar. Keep electrical cords out of the way. Do not use floor polish or wax that makes floors slippery. If you must use wax, use non-skid floor wax. Do not have throw rugs and other things on the floor that can make you trip. What can I do with my stairs? Do not leave any items on the stairs. Make sure that there are handrails on both sides of the stairs and use them. Fix handrails that are broken or loose. Make sure that handrails are as long as the stairways. Check any carpeting to make sure that it is firmly attached to the  stairs. Fix any carpet that is loose or worn. Avoid having throw rugs at the top or bottom of the stairs. If you do have throw rugs, attach them to the floor with carpet tape. Make sure that you have a light switch at the top of the stairs and the bottom of the stairs. If you do not have them, ask someone to add them for you. What else can I do to help prevent falls? Wear shoes that: Do not have high heels. Have rubber bottoms. Are comfortable and fit you well. Are closed at the toe. Do not wear sandals. If you use a stepladder: Make sure that it is fully opened. Do not climb a closed stepladder. Make sure that  both sides of the stepladder are locked into place. Ask someone to hold it for you, if possible. Clearly mark and make sure that you can see: Any grab bars or handrails. First and last steps. Where the edge of each step is. Use tools that help you move around (mobility aids) if they are needed. These include: Canes. Walkers. Scooters. Crutches. Turn on the lights when you go into a dark area. Replace any light bulbs as soon as they burn out. Set up your furniture so you have a clear path. Avoid moving your furniture around. If any of your floors are uneven, fix them. If there are any pets around you, be aware of where they are. Review your medicines with your doctor. Some medicines can make you feel dizzy. This can increase your chance of falling. Ask your doctor what other things that you can do to help prevent falls. This information is not intended to replace advice given to you by your health care provider. Make sure you discuss any questions you have with your health care provider. Document Released: 10/08/2009 Document Revised: 05/19/2016 Document Reviewed: 01/16/2015 Elsevier Interactive Patient Education  2017 [employer].

## 2022-01-28 NOTE — Progress Notes (Signed)
Subjective:   Alyssa Cross is a 66 y.o. female who presents for an Initial Medicare Annual Wellness Visit.  I connected with Beatriz today by telephone and verified that I am speaking with the correct person using two identifiers. Location patient: home Location provider: work Persons participating in the virtual visit: patient, Marine scientist.    I discussed the limitations, risks, security and privacy concerns of performing an evaluation and management service by telephone and the availability of in person appointments. I also discussed with the patient that there may be a patient responsible charge related to this service. The patient expressed understanding and verbally consented to this telephonic visit.    Interactive audio and video telecommunications were attempted between this provider and patient, however failed, due to patient having technical difficulties OR patient did not have access to video capability.  We continued and completed visit with audio only.  Some vital signs may be absent or patient reported.   Time Spent with patient on telephone encounter: 25 minutes   Review of Systems     Cardiac Risk Factors include: advanced age (>21men, >64 women);dyslipidemia     Objective:    Today's Vitals   01/28/22 1021  Weight: 149 lb (67.6 kg)  Height: 5\' 6"  (1.676 m)   Body mass index is 24.05 kg/m.  Advanced Directives 01/28/2022  Does Patient Have a Medical Advance Directive? No  Would patient like information on creating a medical advance directive? Yes (MAU/Ambulatory/Procedural Areas - Information given)    Current Medications (verified) Outpatient Encounter Medications as of 01/28/2022  Medication Sig   Ascorbic Acid (VITAMIN C) 1000 MG tablet Take 1,000 mg by mouth daily.   CALCIUM PO Take 1,200 mg by mouth daily.   Cholecalciferol (VITAMIN D3 PO) Take 1,000 Units by mouth daily.   diphenhydramine-acetaminophen (TYLENOL PM) 25-500 MG TABS tablet Take 1 tablet by  mouth at bedtime as needed.   Elderberry 500 MG CAPS Take by mouth.   Probiotic Product (PROBIOTIC-10 PO) Take by mouth.   zinc gluconate 50 MG tablet Take 50 mg by mouth daily.   diphenhydrAMINE (BENADRYL) 25 mg capsule daily.  (Patient not taking: Reported on 12/24/2021)   [DISCONTINUED] cyclobenzaprine (FLEXERIL) 10 MG tablet Take 1 tablet (10 mg total) by mouth at bedtime as needed for muscle spasms.   [DISCONTINUED] predniSONE (DELTASONE) 10 MG tablet 4 tablets x 2 days, 3 tabs x 2 days, 2 tabs x 2 days, 1 tab x 2 days   No facility-administered encounter medications on file as of 01/28/2022.    Allergies (verified) Patient has no known allergies.   History: Past Medical History:  Diagnosis Date   Allergy    No known health problems    Past Surgical History:  Procedure Laterality Date   COLONOSCOPY     TONSILLECTOMY  66 yrs old   WISDOM TOOTH EXTRACTION     Family History  Problem Relation Age of Onset   Diabetes Mother        type 2   Heart disease Mother    Heart failure Father    Cancer Father        prostate   Hypertension Sister    Colon cancer Neg Hx    Esophageal cancer Neg Hx    Stomach cancer Neg Hx    Rectal cancer Neg Hx    Social History   Socioeconomic History   Marital status: Married    Spouse name: Not on file   Number of children:  Not on file   Years of education: Not on file   Highest education level: Not on file  Occupational History   Not on file  Tobacco Use   Smoking status: Former    Packs/day: 0.50    Years: 5.00    Pack years: 2.50    Types: Cigarettes    Quit date: 12/26/1973    Years since quitting: 48.1   Smokeless tobacco: Never  Vaping Use   Vaping Use: Never used  Substance and Sexual Activity   Alcohol use: No    Alcohol/week: 0.0 standard drinks   Drug use: No   Sexual activity: Not Currently    Comment: lives with husband , 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[employer], dietary restrictions  Other Topics Concern    Not on file  Social History Narrative   Not on file   Social Determinants of Health   Financial Resource Strain: Low Risk    Difficulty of Paying Living Expenses: Not hard at all  Food Insecurity: No Food Insecurity   Worried About Charity fundraiser in the Last Year: Never true   Arboriculturist in the Last Year: Never true  Transportation Needs: No Transportation Needs   Lack of Transportation (Medical): No   Lack of Transportation (Non-Medical): No  Physical Activity: Inactive   Days of Exercise per Week: 0 days   Minutes of Exercise per Session: 0 min  Stress: No Stress Concern Present   Feeling of Stress : Only a little  Social Connections: Moderately Isolated   Frequency of Communication with Friends and Family: Three times a week   Frequency of Social Gatherings with Friends and Family: Twice a week   Attends Religious Services: Never   Marine scientist or Organizations: No   Attends Music therapist: Never   Marital Status: Married    Tobacco Counseling Counseling given: Not Answered   Clinical Intake:  Pre-visit preparation completed: Yes  Pain : No/denies pain     BMI - recorded: 24.05 Nutritional Status: BMI of 19-24  Normal Nutritional Risks: None Diabetes: No  How often do you need to have someone help you when you read instructions, pamphlets, or other written materials from your doctor or pharmacy?: 1 - Never  Diabetic?No  Interpreter Needed?: No  Information entered by :: Caroleen Hamman LPn   Activities of Daily Living In your present state of health, do you have any difficulty performing the following activities: 01/28/2022 12/24/2021  Hearing? N N  Vision? N N  Difficulty concentrating or making decisions? N N  Walking or climbing stairs? N N  Dressing or bathing? N N  Doing errands, shopping? N N  Preparing Food and eating ? N -  Using the Toilet? N -  In the past six months, have you accidently leaked urine? N -   Do you have problems with loss of bowel control? N -  Managing your Medications? N -  Managing your Finances? N -  Housekeeping or managing your Housekeeping? N -  Some recent data might be hidden    Patient Care Team: Shelda Pal, DO as PCP - General (Family Medicine)  Indicate any recent Medical Services you may have received from other than [facility] providers in the past year (date may be approximate).     Assessment:   This is a routine wellness examination for [place].  Hearing/Vision screen Hearing Screening - Comments:: No issues Vision Screening - Comments:: Last eye exam-several  years ago  Dietary issues and exercise activities discussed: Current Exercise Habits: The patient does not participate in regular exercise at present, Exercise limited by: None identified   Goals Addressed             This Visit's Progress    Patient Stated       Continue taking vitamins & maintain current health & possibly join silver sneakers       Depression Screen PHQ 2/9 Scores 01/28/2022 12/24/2021 09/17/2021  PHQ - 2 Score 0 0 0  PHQ- 9 Score - - 0    Fall Risk Fall Risk  01/28/2022 12/24/2021 09/17/2021  Falls in the past year? 0 0 0  Number falls in past yr: 0 0 0  Injury with Fall? 0 0 0  Follow up Falls prevention discussed Falls evaluation completed -    FALL RISK PREVENTION PERTAINING TO THE HOME:  Any stairs in or around the home? Yes  If so, are there any without handrails? No  Home free of loose throw rugs in walkways, pet beds, electrical cords, etc? Yes  Adequate lighting in your home to reduce risk of falls? Yes   ASSISTIVE DEVICES UTILIZED TO PREVENT FALLS:  Life alert? No  Use of a cane, walker or w/c? No  Grab bars in the bathroom? No  Shower chair or bench in shower? Yes  Elevated toilet seat or a handicapped toilet? Yes  elevated seat  TIMED UP AND GO:  Was the test performed? No . Phone visit   Cognitive Function:Normal cognitive status  assessed by this Nurse Health Advisor. No abnormalities found.          Immunizations Immunization History  Administered Date(s) Administered   Influenza-Unspecified 10/20/2014   Pneumococcal Polysaccharide-23 06/26/2020   Tdap 11/04/2014    TDAP status: Up to date  Flu Vaccine status: Due, Education has been provided regarding the importance of this vaccine. Advised may receive this vaccine at local pharmacy or Health Dept. Aware to provide a copy of the vaccination record if obtained from local pharmacy or Health Dept. Verbalized acceptance and understanding.  Pneumococcal vaccine status: Due, Education has been provided regarding the importance of this vaccine. Advised may receive this vaccine at local pharmacy or Health Dept. Aware to provide a copy of the vaccination record if obtained from local pharmacy or Health Dept. Verbalized acceptance and understanding.  Covid-19 vaccine status: Declined, Education has been provided regarding the importance of this vaccine but patient still declined. Advised may receive this vaccine at local pharmacy or Health Dept.or vaccine clinic. Aware to provide a copy of the vaccination record if obtained from local pharmacy or Health Dept. Verbalized acceptance and understanding.  Qualifies for Shingles Vaccine? Yes   Zostavax completed No   Shingrix Completed?: No.    Education has been provided regarding the importance of this vaccine. Patient has been advised to call insurance company to determine out of pocket expense if they have not yet received this vaccine. Advised may also receive vaccine at local pharmacy or Health Dept. Verbalized acceptance and understanding.  Screening Tests Health Maintenance  Topic Date Due   Zoster Vaccines- Shingrix (1 of 2) Never done   Pneumonia Vaccine 4+ Years old (2 - PCV) 06/26/2021   INFLUENZA VACCINE  03/25/2022 (Originally 07/26/2021)   COVID-19 Vaccine (1) 12/24/2022 (Originally 05/04/1957)   MAMMOGRAM   10/09/2022   TETANUS/TDAP  11/04/2024   COLONOSCOPY (Pts 45-75yrs Insurance coverage will need to be confirmed)  08/28/2030  DEXA SCAN  Completed   Hepatitis C Screening  Completed   HPV VACCINES  Aged Out    Health Maintenance  Health Maintenance Due  Topic Date Due   Zoster Vaccines- Shingrix (1 of 2) Never done   Pneumonia Vaccine 109+ Years old (2 - PCV) 06/26/2021    Colorectal cancer screening: Type of screening: Colonoscopy. Completed 08/28/2020. Repeat every 5-10 years  Mammogram status: Due-Patient states she will call & schedule.  Bone Density status: Completed 07/03/2020. Results reflect: Bone density results: OSTEOPENIA. Repeat every 2 years.  Lung Cancer Screening: (Low Dose CT Chest recommended if Age 75-80 years, 30 pack-year currently smoking OR have quit w/in 15years.) does not qualify.     Additional Screening:  Hepatitis C Screening: Completed 05/03/2018  Vision Screening: Recommended annual ophthalmology exams for early detection of glaucoma and other disorders of the eye. Is the patient up to date with their annual eye exam?  No  Who is the provider or what is the name of the office in which the patient attends annual eye exams? None-patient plans to call & schedule an appt soon.   Dental Screening: Recommended annual dental exams for proper oral hygiene  Community Resource Referral / Chronic Care Management: CRR required this visit?  No   CCM required this visit?  No      Plan:     I have personally reviewed and noted the following in the patients chart:   Medical and social history Use of alcohol, tobacco or illicit drugs  Current medications and supplements including opioid prescriptions. Patient is not currently taking opioid prescriptions. Functional ability and status Nutritional status Physical activity Advanced directives List of other physicians Hospitalizations, surgeries, and ER visits in previous 12 months Vitals Screenings to  include cognitive, depression, and falls Referrals and appointments  In addition, I have reviewed and discussed with patient certain preventive protocols, quality metrics, and best practice recommendations. A written personalized care plan for preventive services as well as general preventive health recommendations were provided to patient.   Due to this being a telephonic visit, the after visit summary with patients personalized plan was offered to patient via mail or my-chart.  Patient would like to access on my-chart.   Marta Antu, LPN   07/29/4195  Nurse Health Advisor  Nurse Notes: None

## 2022-12-02 ENCOUNTER — Encounter: Payer: Self-pay | Admitting: Family Medicine

## 2022-12-02 ENCOUNTER — Ambulatory Visit: Payer: Federal, State, Local not specified - PPO | Admitting: Family Medicine

## 2022-12-02 VITALS — BP 121/80 | HR 77 | Temp 97.7°F | Ht 66.0 in | Wt 152.0 lb

## 2022-12-02 DIAGNOSIS — E782 Mixed hyperlipidemia: Secondary | ICD-10-CM | POA: Diagnosis not present

## 2022-12-02 DIAGNOSIS — Z0000 Encounter for general adult medical examination without abnormal findings: Secondary | ICD-10-CM

## 2022-12-02 DIAGNOSIS — Z23 Encounter for immunization: Secondary | ICD-10-CM | POA: Diagnosis not present

## 2022-12-02 LAB — COMPREHENSIVE METABOLIC PANEL
ALT: 21 U/L (ref 0–35)
AST: 19 U/L (ref 0–37)
Albumin: 4.4 g/dL (ref 3.5–5.2)
Alkaline Phosphatase: 64 U/L (ref 39–117)
BUN: 13 mg/dL (ref 6–23)
CO2: 33 mEq/L — ABNORMAL HIGH (ref 19–32)
Calcium: 9.5 mg/dL (ref 8.4–10.5)
Chloride: 104 mEq/L (ref 96–112)
Creatinine, Ser: 0.74 mg/dL (ref 0.40–1.20)
GFR: 83.33 mL/min (ref >60.00)
Glucose, Bld: 104 mg/dL — ABNORMAL HIGH (ref 70–99)
Potassium: 4 mEq/L (ref 3.5–5.1)
Sodium: 142 mEq/L (ref 135–145)
Total Bilirubin: 0.4 mg/dL (ref 0.2–1.2)
Total Protein: 6.6 g/dL (ref 6.0–8.3)

## 2022-12-02 LAB — LIPID PANEL
Cholesterol: 208 mg/dL — ABNORMAL HIGH (ref 0–200)
HDL: 58.1 mg/dL (ref >39.00)
LDL Cholesterol: 121 mg/dL — ABNORMAL HIGH (ref 0–99)
NonHDL: 150.09
Total CHOL/HDL Ratio: 4
Triglycerides: 146 mg/dL (ref 0.0–149.0)
VLDL: 29.2 mg/dL (ref 0.0–40.0)

## 2022-12-02 NOTE — Progress Notes (Addendum)
Subjective:   Alyssa Cross is a 66 y.o. female who presents for Medicare Annual (Subsequent) preventive examination.  Review of Systems    10 pt ROS neg       Objective:    Today's Vitals   12/02/22 1104  BP: 121/80  Pulse: 77  Temp: 97.7 F (36.5 C)  TempSrc: Oral  SpO2: 94%  Weight: 152 lb (68.9 kg)  Height: '5\' 6"'$  (1.676 m)   Body mass index is 24.53 kg/m.     12/02/2022   11:29 AM  Advanced Directives  Does Patient Have a Medical Advance Directive? Yes  Would patient like information on creating a medical advance directive?   No- copy requested    Current Medications (verified) Outpatient Encounter Medications as of 12/02/2022  Medication Sig   Ascorbic Acid (VITAMIN C) 1000 MG tablet Take 1,000 mg by mouth daily.   CALCIUM PO Take 1,200 mg by mouth daily.   Cholecalciferol (VITAMIN D3 PO) Take 1,000 Units by mouth daily.   diphenhydrAMINE (BENADRYL) 25 mg capsule daily.  (Patient not taking: Reported on 12/24/2021)   diphenhydramine-acetaminophen (TYLENOL PM) 25-500 MG TABS tablet Take 1 tablet by mouth at bedtime as needed.   Elderberry 500 MG CAPS Take by mouth.   Probiotic Product (PROBIOTIC-10 PO) Take by mouth.   zinc gluconate 50 MG tablet Take 50 mg by mouth daily.   Allergies (verified) Patient has no known allergies.   History: Past Medical History:  Diagnosis Date   No known health problems    Past Surgical History:  Procedure Laterality Date   COLONOSCOPY     TONSILLECTOMY  66 yrs old   94 TOOTH EXTRACTION     Family History  Problem Relation Age of Onset   Diabetes Mother        type 2   Heart disease Mother    Heart failure Father    Cancer Father        prostate   Hypertension Sister    Colon cancer Neg Hx    Esophageal cancer Neg Hx    Stomach cancer Neg Hx    Rectal cancer Neg Hx    Social History   Socioeconomic History   Marital status: Married  Tobacco Use   Smoking status: Former    Packs/day: 0.50    Years:  5.00    Total pack years: 2.50    Types: Cigarettes    Quit date: 12/26/1973    Years since quitting: 48.9   Smokeless tobacco: Never  Vaping Use   Vaping Use: Never used  Substance and Sexual Activity   Alcohol use: No    Alcohol/week: 0.0 standard drinks of alcohol   Drug use: No   Sexual activity: Not Currently    Comment: lives with husband , 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[employer], dietary restrictions   Social Determinants of Health   Financial Resource Strain: [place]  (12/02/2022)   Overall Financial Resource Strain (CARDIA)    Difficulty of Paying Living Expenses: Not hard at all  Food Insecurity: No Food Insecurity (12/02/2022)   Hunger Vital Sign    Worried About Running Out of Food in the Last Year: Never true    Ran Out of Food in the Last Year: Never true  Transportation Needs: No Transportation Needs (01/28/2022)   PRAPARE - Hydrologist (Medical): No    Lack of Transportation (Non-Medical): No  Physical Activity: Inactive (12/02/2022)   Exercise Vital  Sign    Days of Exercise per Week: 0 days    Minutes of Exercise per Session: 0 min  Stress: No Stress Concern Present (12/02/2022)   [place]    Feeling of Stress : Only a little  Social Connections: Moderately Isolated (12/02/2022)   Social Connection and Isolation Panel [NHANES]    Frequency of Communication with Friends and Family: Three times a week    Frequency of Social Gatherings with Friends and Family: 4-5 times a week    Attends Religious Services: Never    Marine scientist or Organizations: No    Attends Music therapist: Never    Marital Status: Married    Tobacco Counseling Nonsmoker  Clinical Intake:  Pre-visit preparation completed: No  Pain : No/denies pain     Nutritional Status: BMI of 19-24  Normal Nutritional Risks: None  How often do you need to have someone help  you when you read instructions, pamphlets, or other written materials from your doctor or pharmacy?: 1 - Never  Diabetic? No  Interpreter Needed?: No      Activities of Daily Living    12/02/2022   10:35 AM 12/24/2021    1:45 PM  In your present state of health, do you have any difficulty performing the following activities:  Hearing? 0 0  Vision? 0 0  Difficulty concentrating or making decisions? 0 0  Walking or climbing stairs? 0 0  Dressing or bathing? 0 0  Doing errands, shopping? 0 0  Preparing Food and eating ? N   Using the Toilet? N   In the past six months, have you accidently leaked urine? N   Do you have problems with loss of bowel control? N   Managing your Medications? N   Managing your Finances? N   Housekeeping or managing your Housekeeping? N     Patient Care Team: Shelda Pal, DO as PCP - General (Family Medicine)  Indicate any recent Medical Services you may have received from other than [facility] providers in the past year (date may be approximate).     Assessment:   This is a routine wellness examination for [place].  Hearing/Vision screen Hearing Screening   '500Hz'$  '1000Hz'$  '2000Hz'$  '4000Hz'$   Right ear Pass Pass Pass Pass  Left ear Pass Pass Pass Fail   Vision Screening   Right eye Left eye Both eyes  Without correction '20/25 20/40 20/25 '$  With correction       Dietary issues and exercise activities discussed:     Goals Addressed   None    Depression Screen    12/02/2022   11:05 AM 01/28/2022   10:34 AM 12/24/2021    1:48 PM 09/17/2021    1:10 PM  PHQ 2/9 Scores  PHQ - 2 Score 0 0 0 0  PHQ- 9 Score 0   0    Fall Risk    12/02/2022   11:05 AM 01/28/2022   10:31 AM 12/24/2021    1:48 PM 09/17/2021    1:23 PM  Fall Risk   Falls in the past year? 0 0 0 0  Number falls in past yr: 0 0 0 0  Injury with Fall? 0 0 0 0  Risk for fall due to : No Fall Risks     Follow up Falls evaluation completed Falls prevention discussed Falls  evaluation completed     [place]:  Any stairs  in or around the home? Yes  If so, are there any without handrails? No  Home free of loose throw rugs in walkways, pet beds, electrical cords, etc? Yes  Adequate lighting in your home to reduce risk of falls? Yes   ASSISTIVE DEVICES UTILIZED TO PREVENT FALLS:  Life alert? No  Use of a cane, walker or w/c? No  Grab bars in the bathroom? No  Shower chair or bench in shower? Yes  Elevated toilet seat or a handicapped toilet? No   TIMED UP AND GO:  Was the test performed? Yes .  Length of time to ambulate 10 feet: 8 sec.   Gait steady and fast without use of assistive device  Cognitive Function: A&O x 4  Immunizations Immunization History  Administered Date(s) Administered   Influenza-Unspecified 10/20/2014   Pneumococcal Polysaccharide-23 06/26/2020   Tdap 11/04/2014    TDAP status: Up to date  Flu Vaccine status: Declined, Education has been provided regarding the importance of this vaccine but patient still declined. Advised may receive this vaccine at local pharmacy or Health Dept. Aware to provide a copy of the vaccination record if obtained from local pharmacy or Health Dept. Verbalized acceptance and understanding.  Pneumococcal vaccine status: Completed during today's visit.  Covid-19 vaccine status: Declined, Education has been provided regarding the importance of this vaccine but patient still declined. Advised may receive this vaccine at local pharmacy or Health Dept.or vaccine clinic. Aware to provide a copy of the vaccination record if obtained from local pharmacy or Health Dept. Verbalized acceptance and understanding.  Qualifies for Shingles Vaccine? Yes   Zostavax completed No   Shingrix Completed?: No.    Education has been provided regarding the importance of this vaccine. Patient has been advised to call insurance company to determine out of pocket expense if they have not  yet received this vaccine. Advised may also receive vaccine at local pharmacy or Health Dept. Verbalized acceptance and understanding.  Screening Tests Health Maintenance  Topic Date Due   Zoster Vaccines- Shingrix (1 of 2) Never done   Pneumonia Vaccine 28+ Years old (2 - PCV) 06/26/2021   MAMMOGRAM  10/09/2022   COVID-19 Vaccine (1) 12/24/2022 (Originally 05/04/1957)   INFLUENZA VACCINE  03/26/2023 (Originally 07/26/2022)   DTaP/Tdap/Td (2 - Td or Tdap) 11/04/2024   COLONOSCOPY (Pts 45-30yr Insurance coverage will need to be confirmed)  08/28/2030   DEXA SCAN  Completed   Hepatitis C Screening  Completed   HPV VACCINES  Aged Out    Health Maintenance  Health Maintenance Due  Topic Date Due   Zoster Vaccines- Shingrix (1 of 2) Never done   Pneumonia Vaccine 68 Years old (2 - PCV) 06/26/2021   MAMMOGRAM  10/09/2022    Colorectal cancer screening: Type of screening: Colonoscopy. Completed 08/28/20. Repeat every 10 years  Mammogram status: Ordered, has to schedule it . Pt provided with contact info and advised to call to schedule appt.   Bone Density status: Completed 07/03/20. Results reflect: Bone density results: OSTEOPENIA. Repeat every 3 years.  Lung Cancer Screening: (Low Dose CT Chest recommended if Age 68-80years, 30 pack-year currently smoking OR have quit w/in 15years.) does not qualify.   Lung Cancer Screening Referral: N/A  Additional Screening:  Hepatitis C Screening: does qualify; Completed 05/03/18  Vision Screening: Recommended annual ophthalmology exams for early detection of glaucoma and other disorders of the eye. Is the patient up to date with their annual eye exam?  Yes  Who is the  provider or what is the name of the office in which the patient attends annual eye exams? [facility] If pt is not established with a provider, would they like to be referred to a provider to establish care?  N/A .   Dental Screening: Recommended annual dental exams for proper oral  hygiene  Community Resource Referral / Chronic Care Management: CRR required this visit?  No   CCM required this visit?  No      Plan:     I have personally reviewed and noted the following in the patient's chart:   Medical and social history Use of alcohol, tobacco or illicit drugs  Current medications and supplements including opioid prescriptions. Patient is not currently taking opioid prescriptions. Functional ability and status Nutritional status Physical activity Advanced directives List of other physicians Hospitalizations, surgeries, and ER visits in previous 12 months Vitals Screenings to include cognitive, depression, and falls Referrals and appointments  In addition, I have reviewed and discussed with patient certain preventive protocols, quality metrics, and best practice recommendations. A written personalized care plan for preventive services as well as general preventive health recommendations were provided to patient.   I spent 25 min w the patient discussing the above in addition to reviewing her chart on the same day of the visit.    [place], DO   12/02/2022

## 2022-12-02 NOTE — Patient Instructions (Addendum)
Give [place] 2-3 business days to get the results of your labs back.   Keep the diet clean and stay active.  The Shingrix vaccine (for shingles) is a 2 shot series spaced 2-6 months apart. It can make people feel low energy, achy and almost like they have the flu for 48 hours after injection. 1/5 people can have nausea and/or vomiting. Please plan accordingly when deciding on when to get this shot. Call your pharmacy to get this. The second shot of the series is less severe regarding the side effects, but it still lasts 48 hours.   Let us know if you need an order for the mammogram.  Let us know if you need anything.

## 2022-12-16 NOTE — Addendum Note (Signed)
Addended by: Ames Coupe on: 12/16/2022 12:59 PM   Modules accepted: Level of Service

## 2023-02-07 ENCOUNTER — Ambulatory Visit (INDEPENDENT_AMBULATORY_CARE_PROVIDER_SITE_OTHER): Payer: Federal, State, Local not specified - PPO | Admitting: *Deleted

## 2023-02-07 DIAGNOSIS — Z0000 Encounter for general adult medical examination without abnormal findings: Secondary | ICD-10-CM | POA: Diagnosis not present

## 2023-02-07 NOTE — Progress Notes (Signed)
Subjective:  Pt completed ADLs, Fall risk, & SDOH during e-check in on 02/06/23.  Answers verified with pt.    Alyssa Cross is a 67 y.o. female who presents for Medicare Annual (Subsequent) preventive examination.  I connected with  Alyssa Cross on 02/07/23 by a audio enabled telemedicine application and verified that I am speaking with the correct person using two identifiers.  Patient Location: Home  Provider Location: Office/Clinic  I discussed the limitations of evaluation and management by telemedicine. The patient expressed understanding and agreed to proceed.   Review of Systems    Defer to PCP Cardiac Risk Factors include: dyslipidemia;advanced age (>32mn, >>30women)     Objective:    There were no vitals filed for this visit. There is no height or weight on file to calculate BMI.     02/07/2023    9:57 AM 01/28/2022   10:29 AM  Advanced Directives  Does Patient Have a Medical Advance Directive? No No  Would patient like information on creating a medical advance directive? No - Patient declined Yes (MAU/Ambulatory/Procedural Areas - Information given)    Current Medications (verified) Outpatient Encounter Medications as of 02/07/2023  Medication Sig   Ascorbic Acid (VITAMIN C) 1000 MG tablet Take 1,000 mg by mouth daily.   CALCIUM PO Take 1,200 mg by mouth daily.   Cholecalciferol (VITAMIN D3 PO) Take 1,000 Units by mouth daily.   diphenhydrAMINE (BENADRYL) 25 mg capsule daily.  (Patient not taking: Reported on 12/24/2021)   diphenhydramine-acetaminophen (TYLENOL PM) 25-500 MG TABS tablet Take 1 tablet by mouth at bedtime as needed.   Elderberry 500 MG CAPS Take by mouth.   Probiotic Product (PROBIOTIC-10 PO) Take by mouth.   zinc gluconate 50 MG tablet Take 50 mg by mouth daily.   No facility-administered encounter medications on file as of 02/07/2023.    Allergies (verified) Patient has no known allergies.   History: Past Medical History:  Diagnosis  Date   No known health problems    Past Surgical History:  Procedure Laterality Date   COLONOSCOPY     TONSILLECTOMY  67 yrs old   [id number] EXTRACTION     Family History  Problem Relation Age of Onset   Diabetes Mother        type 2   Heart disease Mother    Heart failure Father    Cancer Father        prostate   Hypertension Sister    Colon cancer Neg Hx    Esophageal cancer Neg Hx    Stomach cancer Neg Hx    Rectal cancer Neg Hx    Social History   Socioeconomic History   Marital status: Married    Spouse name: Not on file   Number of children: Not on file   Years of education: Not on file   Highest education level: Not on file  Occupational History   Not on file  Tobacco Use   Smoking status: Former    Packs/day: 0.50    Years: 5.00    Total pack years: 2.50    Types: Cigarettes    Quit date: 12/26/1973    Years since quitting: 49.1   Smokeless tobacco: Never  Vaping Use   Vaping Use: Never used  Substance and Sexual Activity   Alcohol use: No    Alcohol/week: 0.0 standard drinks of alcohol   Drug use: No   Sexual activity: Not Currently    Comment: lives with husband ,  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[employer], dietary restrictions  Other Topics Concern   Not on file  Social History Narrative   Not on file   Social Determinants of Health   Financial Resource Strain: Low Risk  (02/06/2023)   Overall Financial Resource Strain (CARDIA)    Difficulty of Paying Living Expenses: Not hard at all  Food Insecurity: No Food Insecurity (02/06/2023)   Hunger Vital Sign    Worried About Running Out of Food in the Last Year: Never true    Ran Out of Food in the Last Year: Never true  Transportation Needs: No Transportation Needs (02/06/2023)   PRAPARE - Hydrologist (Medical): No    Lack of Transportation (Non-Medical): No  Physical Activity: Insufficiently Active (02/06/2023)   Exercise Vital Sign    Days of Exercise per Week:  3 days    Minutes of Exercise per Session: 20 min  Stress: No Stress Concern Present (02/06/2023)   [place]    Feeling of Stress : Not at all  Social Connections: Moderately Isolated (02/06/2023)   Social Connection and Isolation Panel [NHANES]    Frequency of Communication with Friends and Family: More than three times a week    Frequency of Social Gatherings with Friends and Family: Three times a week    Attends Religious Services: Never    Active Member of Clubs or Organizations: No    Attends Archivist Meetings: Never    Marital Status: Married    Tobacco Counseling Counseling given: Not Answered   Clinical Intake:  Pre-visit preparation completed: Yes  Pain : No/denies pain  Diabetes: No  How often do you need to have someone help you when you read instructions, pamphlets, or other written materials from your doctor or pharmacy?: 1 - Never   Activities of Daily Living    02/06/2023    7:05 PM  In your present state of health, do you have any difficulty performing the following activities:  Hearing? 0  Vision? 0  Difficulty concentrating or making decisions? 0  Walking or climbing stairs? 0  Dressing or bathing? 0  Doing errands, shopping? 0  Preparing Food and eating ? N  Using the Toilet? N  In the past six months, have you accidently leaked urine? N  Do you have problems with loss of bowel control? N  Managing your Medications? N  Managing your Finances? N  Housekeeping or managing your Housekeeping? N    Patient Care Team: Shelda Pal, DO as PCP - General (Family Medicine)  Indicate any recent Medical Services you may have received from other than [facility] providers in the past year (date may be approximate).     Assessment:   This is a routine wellness examination for [name].  Hearing/Vision screen No results found.  Dietary issues and exercise activities  discussed: Current Exercise Habits: The patient does not participate in regular exercise at present, Exercise limited by: None identified   Goals Addressed   None    Depression Screen    02/07/2023    9:53 AM 12/02/2022   11:05 AM 01/28/2022   10:34 AM 12/24/2021    1:48 PM 09/17/2021    1:10 PM  PHQ 2/9 Scores  PHQ - 2 Score 0 0 0 0 0  PHQ- 9 Score  0   0    Fall Risk    02/06/2023    7:05 PM 12/02/2022  11:05 AM 01/28/2022   10:31 AM 12/24/2021    1:48 PM 09/17/2021    1:23 PM  Fall Risk   Falls in the past year? 1 0 0 0 0  Number falls in past yr: 0 0 0 0 0  Injury with Fall? 0 0 0 0 0  Risk for fall due to : History of fall(s);No Fall Risks No Fall Risks     Follow up Falls evaluation completed Falls evaluation completed Falls prevention discussed Falls evaluation completed     FALL RISK PREVENTION PERTAINING TO THE HOME:  Any stairs in or around the home? Yes  If so, are there any without handrails? No  Home free of loose throw rugs in walkways, pet beds, electrical cords, etc? Yes  Adequate lighting in your home to reduce risk of falls? Yes   ASSISTIVE DEVICES UTILIZED TO PREVENT FALLS:  Life alert? No  Use of a cane, walker or w/c? No  Grab bars in the bathroom? No  Shower chair or bench in shower?  Built in bench Elevated toilet seat or a handicapped toilet? Yes   TIMED UP AND GO:  Was the test performed?  No, audio visit .    Cognitive Function:        02/07/2023   10:04 AM  6CIT Screen  What Year? 0 points  What month? 0 points  What time? 0 points  Count back from 20 0 points  Months in reverse 0 points  Repeat phrase 0 points  Total Score 0 points    Immunizations Immunization History  Administered Date(s) Administered   Influenza-Unspecified 10/20/2014   PNEUMOCOCCAL CONJUGATE-20 12/02/2022   Pneumococcal Polysaccharide-23 06/26/2020   Tdap 11/04/2014    TDAP status: Up to date  Flu Vaccine status: Declined, Education has been  provided regarding the importance of this vaccine but patient still declined. Advised may receive this vaccine at local pharmacy or Health Dept. Aware to provide a copy of the vaccination record if obtained from local pharmacy or Health Dept. Verbalized acceptance and understanding.  Pneumococcal vaccine status: Up to date  Covid-19 vaccine status: Information provided on how to obtain vaccines.   Qualifies for Shingles Vaccine? Yes   Zostavax completed No   Shingrix Completed?: No.    Education has been provided regarding the importance of this vaccine. Patient has been advised to call insurance company to determine out of pocket expense if they have not yet received this vaccine. Advised may also receive vaccine at local pharmacy or Health Dept. Verbalized acceptance and understanding.  Screening Tests Health Maintenance  Topic Date Due   COVID-19 Vaccine (1) Never done   MAMMOGRAM  10/09/2022   INFLUENZA VACCINE  03/26/2023 (Originally 07/26/2022)   DTaP/Tdap/Td (2 - Td or Tdap) 11/04/2024   COLONOSCOPY (Pts 45-65yr Insurance coverage will need to be confirmed)  08/28/2030   Pneumonia Vaccine 67 Years old  Completed   DEXA SCAN  Completed   Hepatitis C Screening  Completed   HPV VACCINES  Aged Out   Zoster Vaccines- Shingrix  Discontinued    Health Maintenance  Health Maintenance Due  Topic Date Due   COVID-19 Vaccine (1) Never done   MAMMOGRAM  10/09/2022    Colorectal cancer screening: Type of screening: Colonoscopy. Completed 08/28/20. Repeat every 10 years  Mammogram status: pt currently looking for a facility that accepts her ins.  Bone Density status: Completed 07/03/20. Results reflect: Bone density results: OSTEOPENIA. Repeat every 2 years.  Lung Cancer Screening: (Low  Dose CT Chest recommended if Age 5-80 years, 30 pack-year currently smoking OR have quit w/in 15years.) does not qualify.   Additional Screening:  Hepatitis C Screening: does qualify; Completed  05/03/18  Vision Screening: Recommended annual ophthalmology exams for early detection of glaucoma and other disorders of the eye. Is the patient up to date with their annual eye exam?  Yes  Who is the provider or what is the name of the office in which the patient attends annual eye exams? [facility] If pt is not established with a provider, would they like to be referred to a provider to establish care? No .   Dental Screening: Recommended annual dental exams for proper oral hygiene  Community Resource Referral / Chronic Care Management: CRR required this visit?  No   CCM required this visit?  No      Plan:     I have personally reviewed and noted the following in the patient's chart:   Medical and social history Use of alcohol, tobacco or illicit drugs  Current medications and supplements including opioid prescriptions. Patient is not currently taking opioid prescriptions. Functional ability and status Nutritional status Physical activity Advanced directives List of other physicians Hospitalizations, surgeries, and ER visits in previous 12 months Vitals Screenings to include cognitive, depression, and falls Referrals and appointments  In addition, I have reviewed and discussed with patient certain preventive protocols, quality metrics, and best practice recommendations. A written personalized care plan for preventive services as well as general preventive health recommendations were provided to patient.   Due to this being a telephonic visit, the after visit summary with patients personalized plan was offered to patient via mail or my-chart. Patient would like to access on my-chart.  Beatris Ship, [place]   02/07/2023   Nurse Notes: None

## 2023-02-07 NOTE — Patient Instructions (Signed)
Alyssa Cross , Thank you for taking time to come for your Medicare Wellness Visit. I appreciate your ongoing commitment to your health goals. Please review the following plan we discussed and let me know if I can assist you in the future.   These are the goals we discussed:  Goals      Patient Stated     Continue taking vitamins & maintain current health & possibly join silver sneakers        This is a list of the screening recommended for you and due dates:  Health Maintenance  Topic Date Due   COVID-19 Vaccine (1) Never done   Mammogram  10/09/2022   Flu Shot  03/26/2023*   DTaP/Tdap/Td vaccine (2 - Td or Tdap) 11/04/2024   Colon Cancer Screening  08/28/2030   Pneumonia Vaccine  Completed   DEXA scan (bone density measurement)  Completed   Hepatitis C Screening: USPSTF Recommendation to screen - Ages 5-79 yo.  Completed   HPV Vaccine  Aged Out   Zoster (Shingles) Vaccine  Discontinued  *Topic was postponed. The date shown is not the original due date.     Next appointment: Follow up in one year for your annual wellness visit.   Preventive Care 41 Years and Older, Female Preventive care refers to lifestyle choices and visits with your health care provider that can promote health and wellness. What does preventive care include? A yearly physical exam. This is also called an annual well check. Dental exams once or twice a year. Routine eye exams. Ask your health care provider how often you should have your eyes checked. Personal lifestyle choices, including: Daily care of your teeth and gums. Regular physical activity. Eating a healthy diet. Avoiding tobacco and drug use. Limiting alcohol use. Practicing safe sex. Taking low-dose aspirin every day. Taking vitamin and mineral supplements as recommended by your health care provider. What happens during an annual well check? The services and screenings done by your health care provider during your annual well check will  depend on your age, overall health, lifestyle risk factors, and family history of disease. Counseling  Your health care provider may ask you questions about your: Alcohol use. Tobacco use. Drug use. Emotional well-being. Home and relationship well-being. Sexual activity. Eating habits. History of falls. Memory and ability to understand (cognition). Work and work Statistician. Reproductive health. Screening  You may have the following tests or measurements: Height, weight, and BMI. Blood pressure. Lipid and cholesterol levels. These may be checked every 5 years, or more frequently if you are over 54 years old. Skin check. Lung cancer screening. You may have this screening every year starting at age 59 if you have a 30-pack-year history of smoking and currently smoke or have quit within the past 15 years. Fecal occult blood test (FOBT) of the stool. You may have this test every year starting at age 45. Flexible sigmoidoscopy or colonoscopy. You may have a sigmoidoscopy every 5 years or a colonoscopy every 10 years starting at age 65. Hepatitis C blood test. Hepatitis B blood test. Sexually transmitted disease (STD) testing. Diabetes screening. This is done by checking your blood sugar (glucose) after you have not eaten for a while (fasting). You may have this done every 1-3 years. Bone density scan. This is done to screen for osteoporosis. You may have this done starting at age 88. Mammogram. This may be done every 1-2 years. Talk to your health care provider about how often you should have regular  mammograms. Talk with your health care provider about your test results, treatment options, and if necessary, the need for more tests. Vaccines  Your health care provider may recommend certain vaccines, such as: Influenza vaccine. This is recommended every year. Tetanus, diphtheria, and acellular pertussis (Tdap, Td) vaccine. You may need a Td booster every 10 years. Zoster vaccine. You may  need this after age 9. Pneumococcal 13-valent conjugate (PCV13) vaccine. One dose is recommended after age 63. Pneumococcal polysaccharide (PPSV23) vaccine. One dose is recommended after age 17. Talk to your health care provider about which screenings and vaccines you need and how often you need them. This information is not intended to replace advice given to you by your health care provider. Make sure you discuss any questions you have with your health care provider. Document Released: 01/08/2016 Document Revised: 08/31/2016 Document Reviewed: 10/13/2015 Elsevier Interactive Patient Education  2017 [place] Prevention in the Home Falls can cause injuries. They can happen to people of all ages. There are many things you can do to make your home safe and to help prevent falls. What can I do on the outside of my home? Regularly fix the edges of walkways and driveways and fix any cracks. Remove anything that might make you trip as you walk through a door, such as a raised step or threshold. Trim any bushes or trees on the path to your home. Use bright outdoor lighting. Clear any walking paths of anything that might make someone trip, such as rocks or tools. Regularly check to see if handrails are loose or broken. Make sure that both sides of any steps have handrails. Any raised decks and porches should have guardrails on the edges. Have any leaves, snow, or ice cleared regularly. Use sand or salt on walking paths during winter. Clean up any spills in your garage right away. This includes oil or grease spills. What can I do in the bathroom? Use night lights. Install grab bars by the toilet and in the tub and shower. Do not use towel bars as grab bars. Use non-skid mats or decals in the tub or shower. If you need to sit down in the shower, use a plastic, non-slip stool. Keep the floor dry. Clean up any water that spills on the floor as soon as it happens. Remove soap buildup in  the tub or shower regularly. Attach bath mats securely with double-sided non-slip rug tape. Do not have throw rugs and other things on the floor that can make you trip. What can I do in the bedroom? Use night lights. Make sure that you have a light by your bed that is easy to reach. Do not use any sheets or blankets that are too big for your bed. They should not hang down onto the floor. Have a firm chair that has side arms. You can use this for support while you get dressed. Do not have throw rugs and other things on the floor that can make you trip. What can I do in the kitchen? Clean up any spills right away. Avoid walking on wet floors. Keep items that you use a lot in easy-to-reach places. If you need to reach something above you, use a strong step stool that has a grab bar. Keep electrical cords out of the way. Do not use floor polish or wax that makes floors slippery. If you must use wax, use non-skid floor wax. Do not have throw rugs and other things on the floor that can  make you trip. What can I do with my stairs? Do not leave any items on the stairs. Make sure that there are handrails on both sides of the stairs and use them. Fix handrails that are broken or loose. Make sure that handrails are as long as the stairways. Check any carpeting to make sure that it is firmly attached to the stairs. Fix any carpet that is loose or worn. Avoid having throw rugs at the top or bottom of the stairs. If you do have throw rugs, attach them to the floor with carpet tape. Make sure that you have a light switch at the top of the stairs and the bottom of the stairs. If you do not have them, ask someone to add them for you. What else can I do to help prevent falls? Wear shoes that: Do not have high heels. Have rubber bottoms. Are comfortable and fit you well. Are closed at the toe. Do not wear sandals. If you use a stepladder: Make sure that it is fully opened. Do not climb a closed  stepladder. Make sure that both sides of the stepladder are locked into place. Ask someone to hold it for you, if possible. Clearly mark and make sure that you can see: Any grab bars or handrails. First and last steps. Where the edge of each step is. Use tools that help you move around (mobility aids) if they are needed. These include: Canes. Walkers. Scooters. Crutches. Turn on the lights when you go into a dark area. Replace any light bulbs as soon as they burn out. Set up your furniture so you have a clear path. Avoid moving your furniture around. If any of your floors are uneven, fix them. If there are any pets around you, be aware of where they are. Review your medicines with your doctor. Some medicines can make you feel dizzy. This can increase your chance of falling. Ask your doctor what other things that you can do to help prevent falls. This information is not intended to replace advice given to you by your health care provider. Make sure you discuss any questions you have with your health care provider. Document Released: 10/08/2009 Document Revised: 05/19/2016 Document Reviewed: 01/16/2015 Elsevier Interactive Patient Education  2017 [employer].

## 2023-10-03 ENCOUNTER — Other Ambulatory Visit: Payer: Self-pay | Admitting: Family Medicine

## 2023-12-05 ENCOUNTER — Encounter: Payer: Self-pay | Admitting: Family Medicine

## 2023-12-05 ENCOUNTER — Ambulatory Visit: Payer: Federal, State, Local not specified - PPO | Admitting: Family Medicine

## 2023-12-05 VITALS — BP 122/80 | HR 88 | Temp 98.0°F | Resp 16 | Ht 66.0 in | Wt 150.8 lb

## 2023-12-05 DIAGNOSIS — E782 Mixed hyperlipidemia: Secondary | ICD-10-CM | POA: Diagnosis not present

## 2023-12-05 DIAGNOSIS — Z0000 Encounter for general adult medical examination without abnormal findings: Secondary | ICD-10-CM | POA: Diagnosis not present

## 2023-12-05 LAB — COMPREHENSIVE METABOLIC PANEL
ALT: 18 U/L (ref 0–35)
AST: 16 U/L (ref 0–37)
Albumin: 4.3 g/dL (ref 3.5–5.2)
Alkaline Phosphatase: 66 U/L (ref 39–117)
BUN: 11 mg/dL (ref 6–23)
CO2: 30 meq/L (ref 19–32)
Calcium: 8.8 mg/dL (ref 8.4–10.5)
Chloride: 103 meq/L (ref 96–112)
Creatinine, Ser: 0.72 mg/dL (ref 0.40–1.20)
GFR: 85.51 mL/min (ref >60.00)
Glucose, Bld: 104 mg/dL — ABNORMAL HIGH (ref 70–99)
Potassium: 4 meq/L (ref 3.5–5.1)
Sodium: 142 meq/L (ref 135–145)
Total Bilirubin: 0.6 mg/dL (ref 0.2–1.2)
Total Protein: 6.4 g/dL (ref 6.0–8.3)

## 2023-12-05 LAB — CBC
HCT: 43.1 % (ref 36.0–46.0)
Hemoglobin: 14.6 g/dL (ref 12.0–15.0)
MCHC: 33.8 g/dL (ref 30.0–36.0)
MCV: 86.3 fL (ref 78.0–100.0)
Platelets: 234 10*3/uL (ref 150.0–400.0)
RBC: 4.99 Mil/uL (ref 3.87–5.11)
RDW: 13.1 % (ref 11.5–15.5)
WBC: 6.2 10*3/uL (ref 4.0–10.5)

## 2023-12-05 LAB — LIPID PANEL
Cholesterol: 185 mg/dL (ref 0–200)
HDL: 48.7 mg/dL (ref >39.00)
LDL Cholesterol: 116 mg/dL — ABNORMAL HIGH (ref 0–99)
NonHDL: 136.16
Total CHOL/HDL Ratio: 4
Triglycerides: 102 mg/dL (ref 0.0–149.0)
VLDL: 20.4 mg/dL (ref 0.0–40.0)

## 2023-12-05 NOTE — Progress Notes (Signed)
Chief Complaint  Patient presents with   Annual Exam    Annual Exam     Well Woman Alyssa Cross is here for a complete physical.   Her last physical was >1 year ago.  Current diet: in general, diet is fair. Current exercise: walking. Weight is stable and she denies daytime fatigue. Seatbelt? Yes Advanced directive? Yes  Health Maintenance Colonoscopy- Yes Shingrix- No DEXA- Due Mammogram- Due Tetanus- Yes Pneumonia- Yes Hep C screen- Yes  Past Medical History:  Diagnosis Date   No known health problems      Past Surgical History:  Procedure Laterality Date   COLONOSCOPY     TONSILLECTOMY  67 yrs old   WISDOM TOOTH EXTRACTION      Medications  Current Outpatient Medications on File Prior to Visit  Medication Sig Dispense Refill   Ascorbic Acid (VITAMIN C) 1000 MG tablet Take 1,000 mg by mouth daily.     CALCIUM PO Take 1,200 mg by mouth daily.     Cholecalciferol (VITAMIN D3 PO) Take 1,000 Units by mouth daily.     Elderberry 500 MG CAPS Take by mouth.     Probiotic Product (PROBIOTIC-10 PO) Take by mouth.     zinc gluconate 50 MG tablet Take 50 mg by mouth daily.      Allergies No Known Allergies  Review of Systems: Constitutional:  no fevers Eye:  no recent significant change in vision Ears:  No changes in hearing Nose/Mouth/Throat:  no complaints of nasal congestion, no sore throat Cardiovascular: no chest pain Respiratory:  No shortness of breath Gastrointestinal:  No change in bowel habits GU:  Female: negative for dysuria Integumentary:  no abnormal skin lesions reported Neurologic:  no headaches Endocrine:  denies unexplained weight changes  Exam BP 122/80 (BP Location: Left Arm, Patient Position: Sitting, Cuff Size: Normal)   Pulse 88   Temp 98 F (36.7 C) (Oral)   Resp 16   Ht 5\' 6"  (1.676 m)   Wt 150 lb 12.8 oz (68.4 kg)   SpO2 96%   BMI 24.34 kg/m  General:  well developed, well nourished, in no apparent distress Skin:  no  significant moles, warts, or growths Head:  no masses, lesions, or tenderness Eyes:  pupils equal and round, sclera anicteric without injection Ears:  canals without lesions, TMs shiny without retraction, no obvious effusion, no erythema Nose:  nares patent, mucosa normal, and no drainage Throat/Pharynx:  lips and gingiva without lesion; tongue and uvula midline; non-inflamed pharynx; no exudates or postnasal drainage Neck: neck supple without adenopathy, thyromegaly, or masses Lungs:  clear to auscultation, breath sounds equal bilaterally, no respiratory distress Cardio:  regular rate and rhythm, no bruits or LE edema Abdomen:  abdomen soft, nontender; bowel sounds normal; no masses or organomegaly Genital: Deferred Neuro:  gait normal; deep tendon reflexes normal and symmetric Psych: well oriented with normal range of affect and appropriate judgment/insight  Assessment and Plan  Well adult exam  Mixed hyperlipidemia - Plan: Comprehensive metabolic panel, CBC, Lipid panel   Well 67 y.o. female. Counseled on diet and exercise. Advanced directive form requested today.  Flu shot politely declined.  New ins come Jan, will place a reminder tom get a DEXA and mammogram then. She has some weakness in the right knee and nagging pain in her back.  Stretches and exercises provided.  Could consider physical therapy if no improvement over the next month. Other orders as above. Follow up in 1 yr. The patient  voiced understanding and agreement to the plan.  Jilda Roche [place], DO 12/05/23 8:43 AM

## 2023-12-05 NOTE — Patient Instructions (Addendum)
Give [place] 2-3 business days to get the results of your labs back.   Keep the diet clean and stay active.  Please consider adding some weight resistance exercise to your routine. Consider yoga as well.   Please get me a copy of your advanced directive form at your convenience.   Let us know if you need anything.  Knee Exercises It is normal to feel mild stretching, pulling, tightness, or discomfort as you do these exercises, but you should stop right away if you feel sudden pain or your pain gets worse.  STRETCHING AND RANGE OF MOTION EXERCISES  These exercises warm up your muscles and joints and improve the movement and flexibility of your knee. These exercises also help to relieve pain, numbness, and tingling. Exercise A: Knee Extension, Prone  Lie on your abdomen on a bed. Place your left / right knee just beyond the edge of the surface so your knee is not on the bed. You can put a towel under your left / right thigh just above your knee for comfort. Relax your leg muscles and allow gravity to straighten your knee. You should feel a stretch behind your left / right knee. Hold this position for 30 seconds. Scoot up so your knee is supported between repetitions. Repeat 2 times. Complete this stretch 3 times per week. Exercise B: Knee Flexion, Active     Lie on your back with both knees straight. If this causes back discomfort, bend your left / right knee so your foot is flat on the floor. Slowly slide your left / right heel back toward your buttocks until you feel a gentle stretch in the front of your knee or thigh. Hold this position for 30 seconds. Slowly slide your left / right heel back to the starting position. Repeat 2 times. Complete this exercise 3 times per week. Exercise C: Quadriceps, Prone     Lie on your abdomen on a firm surface, such as a bed or padded floor. Bend your left / right knee and hold your ankle. If you cannot reach your ankle or pant leg, loop a belt around  your foot and grab the belt instead. Gently pull your heel toward your buttocks. Your knee should not slide out to the side. You should feel a stretch in the front of your thigh and knee. Hold this position for 30 seconds. Repeat 2 times. Complete this stretch 3 times per week. Exercise D: Hamstring, Supine  Lie on your back. Loop a belt or towel over the ball of your left / right foot. The ball of your foot is on the walking surface, right under your toes. Straighten your left / right knee and slowly pull on the belt to raise your leg until you feel a gentle stretch behind your knee. Do not let your left / right knee bend while you do this. Keep your other leg flat on the floor. Hold this position for 30 seconds. Repeat 2 times. Complete this stretch 3 times per week. STRENGTHENING EXERCISES  These exercises build strength and endurance in your knee. Endurance is the ability to use your muscles for a long time, even after they get tired. Exercise E: Quadriceps, Isometric     Lie on your back with your left / right leg extended and your other knee bent. Put a rolled towel or small pillow under your knee if told by your health care provider. Slowly tense the muscles in the front of your left / right thigh. You  should see your kneecap slide up toward your hip or see increased dimpling just above the knee. This motion will push the back of the knee toward the floor. For 3 seconds, keep the muscle as tight as you can without increasing your pain. Relax the muscles slowly and completely. Repeat for 10 total reps Repeat 2 ti mes. Complete this exercise 3 times per week. Exercise F: Straight Leg Raises - Quadriceps  Lie on your back with your left / right leg extended and your other knee bent. Tense the muscles in the front of your left / right thigh. You should see your kneecap slide up or see increased dimpling just above the knee. Your thigh may even shake a bit. Keep these muscles tight as you  raise your leg 4-6 inches (10-15 cm) off the floor. Do not let your knee bend. Hold this position for 3 seconds. Keep these muscles tense as you lower your leg. Relax your muscles slowly and completely after each repetition. 10 total reps. Repeat 2 times. Complete this exercise 3 times per week.  Exercise G: Hamstring Curls     If told by your health care provider, do this exercise while wearing ankle weights. Begin with 5 lb weights (optional). Then increase the weight by 1 lb (0.5 kg) increments. Do not wear ankle weights that are more than 20 lbs to start with. Lie on your abdomen with your legs straight. Bend your left / right knee as far as you can without feeling pain. Keep your hips flat against the floor. Hold this position for 3 seconds. Slowly lower your leg to the starting position. Repeat for 10 reps.  Repeat 2 times. Complete this exercise 3 times per week. Exercise H: Squats (Quadriceps)  Stand in front of a table, with your feet and knees pointing straight ahead. You may rest your hands on the table for balance but not for support. Slowly bend your knees and lower your hips like you are going to sit in a chair. Keep your weight over your heels, not over your toes. Keep your lower legs upright so they are parallel with the table legs. Do not let your hips go lower than your knees. Do not bend lower than told by your health care provider. If your knee pain increases, do not bend as low. Hold the squat position for 1 second. Slowly push with your legs to return to standing. Do not use your hands to pull yourself to standing. Repeat 2 times. Complete this exercise 3 times per week. Exercise I: Wall Slides (Quadriceps)     Lean your back against a smooth wall or door while you walk your feet out 18-24 inches (46-61 cm) from it. Place your feet hip-width apart. Slowly slide down the wall or door until your knees Repeat 2 times. Complete this exercise every other day. Exercise  K: Straight Leg Raises - Hip Abductors  Lie on your side with your left / right leg in the top position. Lie so your head, shoulder, knee, and hip line up. You may bend your bottom knee to help you keep your balance. Roll your hips slightly forward so your hips are stacked directly over each other and your left / right knee is facing forward. Leading with your heel, lift your top leg 4-6 inches (10-15 cm). You should feel the muscles in your outer hip lifting. Do not let your foot drift forward. Do not let your knee roll toward the ceiling. Hold this position for  3 seconds. Slowly return your leg to the starting position. Let your muscles relax completely after each repetition. 10 total reps. Repeat 2 times. Complete this exercise 3 times per week. Exercise J: Straight Leg Raises - Hip Extensors  Lie on your abdomen on a firm surface. You can put a pillow under your hips if that is more comfortable. Tense the muscles in your buttocks and lift your left / right leg about 4-6 inches (10-15 cm). Keep your knee straight as you lift your leg. Hold this position for 3 seconds. Slowly lower your leg to the starting position. Let your leg relax completely after each repetition. Repeat 2 times. Complete this exercise 3 times per week. Document Released: 10/26/2005 Document Revised: 09/05/2016 Document Reviewed: 10/18/2015 [employer] Interactive Patient Education  2017 [employer]  EXERCISES  RANGE OF MOTION (ROM) AND STRETCHING EXERCISES - Low Back Pain Most people with lower back pain will find that their symptoms get worse with excessive bending forward (flexion) or arching at the lower back (extension). The exercises that will help resolve your symptoms will focus on the opposite motion.  If you have pain, numbness or tingling which travels down into your buttocks, leg or foot, the goal of the therapy is for these symptoms to move closer to your back and eventually resolve. Sometimes, these leg  symptoms will get better, but your lower back pain may worsen. This is often an indication of progress in your rehabilitation. Be very alert to any changes in your symptoms and the activities in which you participated in the 24 hours prior to the change. Sharing this information with your caregiver will allow him or her to most efficiently treat your condition. These exercises may help you when beginning to rehabilitate your injury. Your symptoms may resolve with or without further involvement from your physician, physical therapist or athletic trainer. While completing these exercises, remember:  Restoring tissue flexibility helps normal motion to return to the joints. This allows healthier, less painful movement and activity. An effective stretch should be held for at least 30 seconds. A stretch should never be painful. You should only feel a gentle lengthening or release in the stretched tissue. FLEXION RANGE OF MOTION AND STRETCHING EXERCISES:  STRETCH - Flexion, Single Knee to Chest  Lie on a firm bed or floor with both legs extended in front of you. Keeping one leg in contact with the floor, bring your opposite knee to your chest. Hold your leg in place by either grabbing behind your thigh or at your knee. Pull until you feel a gentle stretch in your low back. Hold 30 seconds. Slowly release your grasp and repeat the exercise with the opposite side. Repeat 2 times. Complete this exercise 3 times per week.   STRETCH - Flexion, Double Knee to Chest Lie on a firm bed or floor with both legs extended in front of you. Keeping one leg in contact with the floor, bring your opposite knee to your chest. Tense your stomach muscles to support your back and then lift your other knee to your chest. Hold your legs in place by either grabbing behind your thighs or at your knees. Pull both knees toward your chest until you feel a gentle stretch in your low back. Hold 30 seconds. Tense your stomach muscles  and slowly return one leg at a time to the floor. Repeat 2 times. Complete this exercise 3 times per week.   STRETCH - Low Trunk Rotation Lie on a firm bed or  floor. Keeping your legs in front of you, bend your knees so they are both pointed toward the ceiling and your feet are flat on the floor. Extend your arms out to the side. This will stabilize your upper body by keeping your shoulders in contact with the floor. Gently and slowly drop both knees together to one side until you feel a gentle stretch in your low back. Hold for 30 seconds. Tense your stomach muscles to support your lower back as you bring your knees back to the starting position. Repeat the exercise to the other side. Repeat 2 times. Complete this exercise at least 3 times per week.   EXTENSION RANGE OF MOTION AND FLEXIBILITY EXERCISES:  STRETCH - Extension, Prone on Elbows  Lie on your stomach on the floor, a bed will be too soft. Place your palms about shoulder width apart and at the height of your head. Place your elbows under your shoulders. If this is too painful, stack pillows under your chest. Allow your body to relax so that your hips drop lower and make contact more completely with the floor. Hold this position for 30 seconds. Slowly return to lying flat on the floor. Repeat 2 times. Complete this exercise 3 times per week.   RANGE OF MOTION - Extension, Prone Press Ups Lie on your stomach on the floor, a bed will be too soft. Place your palms about shoulder width apart and at the height of your head. Keeping your back as relaxed as possible, slowly straighten your elbows while keeping your hips on the floor. You may adjust the placement of your hands to maximize your comfort. As you gain motion, your hands will come more underneath your shoulders. Hold this position 30 seconds. Slowly return to lying flat on the floor. Repeat 2 times. Complete this exercise 3 times per week.   RANGE OF MOTION- Quadruped, Neutral  Spine  Assume a hands and knees position on a firm surface. Keep your hands under your shoulders and your knees under your hips. You may place padding under your knees for comfort. Drop your head and point your tailbone toward the ground below you. This will round out your lower back like an angry cat. Hold this position for 30 seconds. Slowly lift your head and release your tail bone so that your back sags into a large arch, like an old horse. Hold this position for 30 seconds. Repeat this until you feel limber in your low back. Now, find your "sweet spot." This will be the most comfortable position somewhere between the two previous positions. This is your neutral spine. Once you have found this position, tense your stomach muscles to support your low back. Hold this position for 30 seconds. Repeat 2 times. Complete this exercise 3 times per week.   STRENGTHENING EXERCISES - Low Back Sprain These exercises may help you when beginning to rehabilitate your injury. These exercises should be done near your "sweet spot." This is the neutral, low-back arch, somewhere between fully rounded and fully arched, that is your least painful position. When performed in this safe range of motion, these exercises can be used for people who have either a flexion or extension based injury. These exercises may resolve your symptoms with or without further involvement from your physician, physical therapist or athletic trainer. While completing these exercises, remember:  Muscles can gain both the endurance and the strength needed for everyday activities through controlled exercises. Complete these exercises as instructed by your physician, physical therapist  or Event organiser. Increase the resistance and repetitions only as guided. You may experience muscle soreness or fatigue, but the pain or discomfort you are trying to eliminate should never worsen during these exercises. If this pain does worsen, stop and make  certain you are following the directions exactly. If the pain is still present after adjustments, discontinue the exercise until you can discuss the trouble with your caregiver.  STRENGTHENING - Deep Abdominals, Pelvic Tilt  Lie on a firm bed or floor. Keeping your legs in front of you, bend your knees so they are both pointed toward the ceiling and your feet are flat on the floor. Tense your lower abdominal muscles to press your low back into the floor. This motion will rotate your pelvis so that your tail bone is scooping upwards rather than pointing at your feet or into the floor. With a gentle tension and even breathing, hold this position for 3 seconds. Repeat 2 times. Complete this exercise 3 times per week.   STRENGTHENING - Abdominals, Crunches  Lie on a firm bed or floor. Keeping your legs in front of you, bend your knees so they are both pointed toward the ceiling and your feet are flat on the floor. Cross your arms over your chest. Slightly tip your chin down without bending your neck. Tense your abdominals and slowly lift your trunk high enough to just clear your shoulder blades. Lifting higher can put excessive stress on the lower back and does not further strengthen your abdominal muscles. Control your return to the starting position. Repeat 2 times. Complete this exercise 3 times per week.   STRENGTHENING - Quadruped, Opposite UE/LE Lift  Assume a hands and knees position on a firm surface. Keep your hands under your shoulders and your knees under your hips. You may place padding under your knees for comfort. Find your neutral spine and gently tense your abdominal muscles so that you can maintain this position. Your shoulders and hips should form a rectangle that is parallel with the floor and is not twisted. Keeping your trunk steady, lift your right hand no higher than your shoulder and then your left leg no higher than your hip. Make sure you are not holding your breath. Hold this  position for 30 seconds. Continuing to keep your abdominal muscles tense and your back steady, slowly return to your starting position. Repeat with the opposite arm and leg. Repeat 2 times. Complete this exercise 3 times per week.   STRENGTHENING - Abdominals and Quadriceps, Straight Leg Raise  Lie on a firm bed or floor with both legs extended in front of you. Keeping one leg in contact with the floor, bend the other knee so that your foot can rest flat on the floor. Find your neutral spine, and tense your abdominal muscles to maintain your spinal position throughout the exercise. Slowly lift your straight leg off the floor about 6 inches for a count of 3, making sure to not hold your breath. Still keeping your neutral spine, slowly lower your leg all the way to the floor. Repeat this exercise with each leg 2 times. Complete this exercise 3 times per week.  POSTURE AND BODY MECHANICS CONSIDERATIONS - Low Back Sprain Keeping correct posture when sitting, standing or completing your activities will reduce the stress put on different body tissues, allowing injured tissues a chance to heal and limiting painful experiences. The following are general guidelines for improved posture.  While reading these guidelines, remember: The exercises prescribed by  your provider will help you have the flexibility and strength to maintain correct postures. The correct posture provides the best environment for your joints to work. All of your joints have less wear and tear when properly supported by a spine with good posture. This means you will experience a healthier, less painful body. Correct posture must be practiced with all of your activities, especially prolonged sitting and standing. Correct posture is as important when doing repetitive low-stress activities (typing) as it is when doing a single heavy-load activity (lifting).  RESTING POSITIONS Consider which positions are most painful for you when choosing a  resting position. If you have pain with flexion-based activities (sitting, bending, stooping, squatting), choose a position that allows you to rest in a less flexed posture. You would want to avoid curling into a fetal position on your side. If your pain worsens with extension-based activities (prolonged standing, working overhead), avoid resting in an extended position such as sleeping on your stomach. Most people will find more comfort when they rest with their spine in a more neutral position, neither too rounded nor too arched. Lying on a non-sagging bed on your side with a pillow between your knees, or on your back with a pillow under your knees will often provide some relief. Keep in mind, being in any one position for a prolonged period of time, no matter how correct your posture, can still lead to stiffness.  PROPER SITTING POSTURE In order to minimize stress and discomfort on your spine, you must sit with correct posture. Sitting with good posture should be effortless for a healthy body. Returning to good posture is a gradual process. Many people can work toward this most comfortably by using various supports until they have the flexibility and strength to maintain this posture on their own. When sitting with proper posture, your ears will fall over your shoulders and your shoulders will fall over your hips. You should use the back of the chair to support your upper back. Your lower back will be in a neutral position, just slightly arched. You may place a small pillow or folded towel at the base of your lower back for  support.  When working at a desk, create an environment that supports good, upright posture. Without extra support, muscles tire, which leads to excessive strain on joints and other tissues. Keep these recommendations in mind:  CHAIR: A chair should be able to slide under your desk when your back makes contact with the back of the chair. This allows you to work closely. The chair's  height should allow your eyes to be level with the upper part of your monitor and your hands to be slightly lower than your elbows.  BODY POSITION Your feet should make contact with the floor. If this is not possible, use a foot rest. Keep your ears over your shoulders. This will reduce stress on your neck and low back.  INCORRECT SITTING POSTURES  If you are feeling tired and unable to assume a healthy sitting posture, do not slouch or slump. This puts excessive strain on your back tissues, causing more damage and pain. Healthier options include: Using more support, like a lumbar pillow. Switching tasks to something that requires you to be upright or walking. Talking a brief walk. Lying down to rest in a neutral-spine position.  PROLONGED STANDING WHILE SLIGHTLY LEANING FORWARD  When completing a task that requires you to lean forward while standing in one place for a long time, place either foot  up on a stationary 2-4 inch high object to help maintain the best posture. When both feet are on the ground, the lower back tends to lose its slight inward curve. If this curve flattens (or becomes too large), then the back and your other joints will experience too much stress, tire more quickly, and can cause pain.  CORRECT STANDING POSTURES Proper standing posture should be assumed with all daily activities, even if they only take a few moments, like when brushing your teeth. As in sitting, your ears should fall over your shoulders and your shoulders should fall over your hips. You should keep a slight tension in your abdominal muscles to brace your spine. Your tailbone should point down to the ground, not behind your body, resulting in an over-extended swayback posture.   INCORRECT STANDING POSTURES  Common incorrect standing postures include a forward head, locked knees and/or an excessive swayback. WALKING Walk with an upright posture. Your ears, shoulders and hips should all  line-up.  PROLONGED ACTIVITY IN A FLEXED POSITION When completing a task that requires you to bend forward at your waist or lean over a low surface, try to find a way to stabilize 3 out of 4 of your limbs. You can place a hand or elbow on your thigh or rest a knee on the surface you are reaching across. This will provide you more stability, so that your muscles do not tire as quickly. By keeping your knees relaxed, or slightly bent, you will also reduce stress across your lower back. CORRECT LIFTING TECHNIQUES  DO : Assume a wide stance. This will provide you more stability and the opportunity to get as close as possible to the object which you are lifting. Tense your abdominals to brace your spine. Bend at the knees and hips. Keeping your back locked in a neutral-spine position, lift using your leg muscles. Lift with your legs, keeping your back straight. Test the weight of unknown objects before attempting to lift them. Try to keep your elbows locked down at your sides in order get the best strength from your shoulders when carrying an object.   Always ask for help when lifting heavy or awkward objects. INCORRECT LIFTING TECHNIQUES DO NOT:  Lock your knees when lifting, even if it is a small object. Bend and twist. Pivot at your feet or move your feet when needing to change directions. Assume that you can safely pick up even a paperclip without proper posture.

## 2023-12-08 ENCOUNTER — Encounter: Payer: Self-pay | Admitting: Family Medicine

## 2024-02-13 ENCOUNTER — Ambulatory Visit: Payer: Self-pay | Admitting: Family Medicine

## 2024-02-13 NOTE — Telephone Encounter (Signed)
 Chief Complaint: Right leg swelling from knee down Symptoms: Right leg swelling Frequency: a week Pertinent Negatives: Patient denies fever, shortness of breath, chest pain, redness in leg, prior injury Disposition: [] ED /[x] Urgent Care (no appt availability in office) / [] Appointment(In office/virtual)/ []  [facility]/ [] Home Care/ [] Refused Recommended Disposition /[] [facility]/ []  Follow-up with PCP Additional Notes: Patient called in reporting swelling in her right leg that she has noticed for roughly a week. Patient states she has a golfball size lump behind her right knee, and her calf is swollen as well. Patient denies any redness or warmth to the leg. Patient denies any recent injury or strain, recent long travels, surgeries. Patient unsure of cause of swelling. Advised patient to be evaluated in UC. Patient stated she will be evaluated in UC today.   Copied from CRM [id number]. Topic: Appointments - Appointment Scheduling >> Feb 13, 2024  4:31 PM Alcus Dad wrote: Patient/patient representative is calling to schedule an appointment. Patient is having problems with her right knee and right calf my be a little swollen. May be fluid Reason for Disposition  [1] MODERATE leg swelling (e.g., swelling extends up to knees) AND [2] new-onset or worsening  Answer Assessment - Initial Assessment Questions 1. ONSET: "When did the swelling start?" (e.g., minutes, hours, days)     About a week ago 2. LOCATION: "What part of the leg is swollen?"  "Are both legs swollen or just one leg?"     Right knee, and swelling and fluid behind knee, right calf swollen 3. SEVERITY: "How bad is the swelling?" (e.g., localized; mild, moderate, severe)   - Localized: Small area of swelling localized to one leg.   - MILD pedal edema: Swelling limited to foot and ankle, pitting edema < 1/4 inch (6 mm) deep, rest and elevation eliminate most or all swelling.   - MODERATE edema: Swelling of lower leg  to knee, pitting edema > 1/4 inch (6 mm) deep, rest and elevation only partially reduce swelling.   - SEVERE edema: Swelling extends above knee, facial or hand swelling present.     Behind knee felt like size of egg, swelling in calf is mild 4. REDNESS: "Does the swelling look red or infected?"     No 5. PAIN: "Is the swelling painful to touch?" If Yes, ask: "How painful is it?"   (Scale 1-10; mild, moderate or severe)     0 6. FEVER: "Do you have a fever?" If Yes, ask: "What is it, how was it measured, and when did it start?"      No 7. CAUSE: "What do you think is causing the leg swelling?"     Not sure of cause 8. MEDICAL HISTORY: "Do you have a history of blood clots (e.g., DVT), cancer, heart failure, kidney disease, or liver failure?"     No 9. RECURRENT SYMPTOM: "Have you had leg swelling before?" If Yes, ask: "When was the last time?" "What happened that time?"     No 10. OTHER SYMPTOMS: "Do you have any other symptoms?" (e.g., chest pain, difficulty breathing)       No  Protocols used: Leg Swelling and Edema-A-AH

## 2024-04-30 ENCOUNTER — Ambulatory Visit (INDEPENDENT_AMBULATORY_CARE_PROVIDER_SITE_OTHER)

## 2024-04-30 ENCOUNTER — Telehealth (HOSPITAL_BASED_OUTPATIENT_CLINIC_OR_DEPARTMENT_OTHER): Payer: Self-pay

## 2024-04-30 VITALS — Ht 66.0 in | Wt 150.0 lb

## 2024-04-30 DIAGNOSIS — Z780 Asymptomatic menopausal state: Secondary | ICD-10-CM | POA: Diagnosis not present

## 2024-04-30 DIAGNOSIS — Z1231 Encounter for screening mammogram for malignant neoplasm of breast: Secondary | ICD-10-CM | POA: Diagnosis not present

## 2024-04-30 DIAGNOSIS — Z0000 Encounter for general adult medical examination without abnormal findings: Secondary | ICD-10-CM | POA: Diagnosis not present

## 2024-04-30 NOTE — Progress Notes (Signed)
 Subjective:   Alyssa Cross is a 68 y.o. who presents for a Medicare Wellness preventive visit.  Visit Complete: Virtual I connected with  Rockwell Cinnamon on 04/30/24 by a audio enabled telemedicine application and verified that I am speaking with the correct person using two identifiers.  Patient Location: Home  Provider Location: Home Office  I discussed the limitations of evaluation and management by telemedicine. The patient expressed understanding and agreed to proceed.  Vital Signs: Because this visit was a virtual/telehealth visit, some criteria may be missing or patient reported. Any vitals not documented were not able to be obtained and vitals that have been documented are patient reported.  VideoDeclined- This patient declined Librarian, academic. Therefore the visit was completed with audio only.  Persons Participating in Visit: Patient.  AWV Questionnaire: No: Patient Medicare AWV questionnaire was not completed prior to this visit.  Cardiac Risk Factors include: advanced age (>87men, >42 women);dyslipidemia     Objective:    Today's Vitals   04/30/24 1154  Weight: 150 lb (68 kg)  Height: 5\' 6"  (1.676 m)   Body mass index is 24.21 kg/m.     04/30/2024    2:22 PM 02/07/2023    9:57 AM 01/28/2022   10:29 AM  Advanced Directives  Does Patient Have a Medical Advance Directive? No No No  Would patient like information on creating a medical advance directive? Yes (MAU/Ambulatory/Procedural Areas - Information given) No - Patient declined Yes (MAU/Ambulatory/Procedural Areas - Information given)    Current Medications (verified) Outpatient Encounter Medications as of 04/30/2024  Medication Sig   Ascorbic Acid (VITAMIN C) 1000 MG tablet Take 1,000 mg by mouth daily.   CALCIUM PO Take 1,200 mg by mouth daily.   Cholecalciferol (VITAMIN D3 PO) Take 1,000 Units by mouth daily.   Elderberry 500 MG CAPS Take by mouth.   Probiotic Product  (PROBIOTIC-10 PO) Take by mouth.   zinc gluconate 50 MG tablet Take 50 mg by mouth daily.   No facility-administered encounter medications on file as of 04/30/2024.    Allergies (verified) Patient has no known allergies.   History: Past Medical History:  Diagnosis Date   No known health problems    Past Surgical History:  Procedure Laterality Date   COLONOSCOPY     TONSILLECTOMY  68 yrs old   WISDOM TOOTH EXTRACTION     Family History  Problem Relation Age of Onset   Diabetes Mother        type 2   Heart disease Mother    Heart failure Father    Cancer Father        prostate   Hypertension Sister    Colon cancer Neg Hx    Esophageal cancer Neg Hx    Stomach cancer Neg Hx    Rectal cancer Neg Hx    Social History   Socioeconomic History   Marital status: Married    Spouse name: Not on file   Number of children: Not on file   Years of education: Not on file   Highest education level: 12th grade  Occupational History   Not on file  Tobacco Use   Smoking status: Former    Current packs/day: 0.00    Average packs/day: 0.5 packs/day for 5.0 years (2.5 ttl pk-yrs)    Types: Cigarettes    Start date: 12/26/1968    Quit date: 12/26/1973    Years since quitting: 50.3   Smokeless tobacco: Never  Vaping  Use   Vaping status: Never Used  Substance and Sexual Activity   Alcohol use: No    Alcohol/week: 0.0 standard drinks of alcohol   Drug use: No   Sexual activity: Not Currently    Comment: lives with husband , works as Financial trader care with [employer], dietary restrictions  Other Topics Concern   Not on file  Social History Narrative   Not on file   Social Drivers of Health   Financial Resource Strain: Low Risk  (04/30/2024)   Overall Financial Resource Strain (CARDIA)    Difficulty of Paying Living Expenses: Not hard at all  Food Insecurity: No Food Insecurity (04/30/2024)   Hunger Vital Sign    Worried About Running Out of Food in the Last Year: Never true     Ran Out of Food in the Last Year: Never true  Transportation Needs: No Transportation Needs (04/30/2024)   PRAPARE - Administrator, Civil Service (Medical): No    Lack of Transportation (Non-Medical): No  Physical Activity: Insufficiently Active (04/30/2024)   Exercise Vital Sign    Days of Exercise per Week: 2 days    Minutes of Exercise per Session: 30 min  Stress: No Stress Concern Present (04/30/2024)   [employer] of Occupational Health - Occupational Stress Questionnaire    Feeling of Stress : Only a little  Social Connections: Socially Isolated (04/30/2024)   Social Connection and Isolation Panel [NHANES]    Frequency of Communication with Friends and Family: Once a week    Frequency of Social Gatherings with Friends and Family: Once a week    Attends Religious Services: Never    Database administrator or Organizations: No    Attends Engineer, structural: Never    Marital Status: Married    Tobacco Counseling Counseling given: Not Answered    Clinical Intake:  Pre-visit preparation completed: Yes  Pain : No/denies pain     Diabetes: No  Lab Results  Component Value Date   HGBA1C 5.6 06/26/2020     How often do you need to have someone help you when you read instructions, pamphlets, or other written materials from your doctor or pharmacy?: 1 - Never  Interpreter Needed?: No  Information entered by :: Seabron Cypress LPN   Activities of Daily Living     04/30/2024   10:22 AM  In your present state of health, do you have any difficulty performing the following activities:  Hearing? 0  Vision? 0  Difficulty concentrating or making decisions? 0  Walking or climbing stairs? 0  Dressing or bathing? 0  Doing errands, shopping? 0  Preparing Food and eating ? N  Using the Toilet? N  In the past six months, have you accidently leaked urine? Y  Do you have problems with loss of bowel control? N  Managing your Medications? N  Managing your  Finances? N  Housekeeping or managing your Housekeeping? N    Patient Care Team: Jobe Mulder, DO as PCP - General (Family Medicine)  Indicate any recent Medical Services you may have received from other than [facility] providers in the past year (date may be approximate).     Assessment:   This is a routine wellness examination for [name].  Hearing/Vision screen Hearing Screening - Comments:: Denies hearing difficulties   Vision Screening - Comments:: Wears rx glasses - up to date with routine eye exams with [facility]    Goals Addressed  This Visit's Progress    Patient Stated   On track    Continue taking vitamins & maintain current health & possibly join silver sneakers       Depression Screen     04/30/2024    2:20 PM 12/05/2023    8:10 AM 02/07/2023    9:53 AM 12/02/2022   11:05 AM 01/28/2022   10:34 AM 12/24/2021    1:48 PM 09/17/2021    1:10 PM  PHQ 2/9 Scores  PHQ - 2 Score 0 0 0 0 0 0 0  PHQ- 9 Score    0   0    Fall Risk     04/30/2024   10:22 AM 12/05/2023    8:10 AM 02/06/2023    7:05 PM 12/02/2022   11:05 AM 01/28/2022   10:31 AM  Fall Risk   Falls in the past year? 0 0 1 0 0  Number falls in past yr: 0 0 0 0 0  Injury with Fall? 0 0 0 0 0  Risk for fall due to : No Fall Risks  History of fall(s);No Fall Risks No Fall Risks   Follow up Falls prevention discussed;Education provided;Falls evaluation completed Falls evaluation completed Falls evaluation completed Falls evaluation completed Falls prevention discussed    MEDICARE RISK AT HOME:  Medicare Risk at Home Any stairs in or around the home?: (Patient-Rptd) Yes If so, are there any without handrails?: (Patient-Rptd) Yes Home free of loose throw rugs in walkways, pet beds, electrical cords, etc?: (Patient-Rptd) Yes Adequate lighting in your home to reduce risk of falls?: (Patient-Rptd) Yes Life alert?: (Patient-Rptd) No Use of a cane, walker or w/c?: (Patient-Rptd) No Grab bars  in the bathroom?: (Patient-Rptd) No Shower chair or bench in shower?: (Patient-Rptd) Yes Elevated toilet seat or a handicapped toilet?: (Patient-Rptd) Yes  TIMED UP AND GO:  Was the test performed?  No  Cognitive Function: 6CIT completed        04/30/2024    2:22 PM 02/07/2023   10:04 AM  6CIT Screen  What Year? 0 points 0 points  What month? 0 points 0 points  What time? 0 points 0 points  Count back from 20 0 points 0 points  Months in reverse 0 points 0 points  Repeat phrase 0 points 0 points  Total Score 0 points 0 points    Immunizations Immunization History  Administered Date(s) Administered   Influenza-Unspecified 10/20/2014   PNEUMOCOCCAL CONJUGATE-20 12/02/2022   Pneumococcal Polysaccharide-23 06/26/2020   Tdap 11/04/2014    Screening Tests Health Maintenance  Topic Date Due   MAMMOGRAM  10/09/2022   INFLUENZA VACCINE  07/26/2024   DTaP/Tdap/Td (2 - Td or Tdap) 11/04/2024   Medicare Annual Wellness (AWV)  04/30/2025   Colonoscopy  08/28/2030   Pneumonia Vaccine 68+ Years old  Completed   DEXA SCAN  Completed   Hepatitis C Screening  Completed   HPV VACCINES  Aged Out   Meningococcal B Vaccine  Aged Out   COVID-19 Vaccine  Discontinued   Zoster Vaccines- Shingrix  Discontinued    Health Maintenance  Health Maintenance Due  Topic Date Due   MAMMOGRAM  10/09/2022   Health Maintenance Items Addressed: Mammogram ordered, DEXA ordered  Additional Screening:  Vision Screening: Recommended annual ophthalmology exams for early detection of glaucoma and other disorders of the eye.  Dental Screening: Recommended annual dental exams for proper oral hygiene  Community Resource Referral / Chronic Care Management: CRR required this visit?  No  CCM required this visit?  No     Plan:     I have personally reviewed and noted the following in the patient's chart:   Medical and social history Use of alcohol, tobacco or illicit drugs  Current  medications and supplements including opioid prescriptions. Patient is not currently taking opioid prescriptions. Functional ability and status Nutritional status Physical activity Advanced directives List of other physicians Hospitalizations, surgeries, and ER visits in previous 12 months Vitals Screenings to include cognitive, depression, and falls Referrals and appointments  In addition, I have reviewed and discussed with patient certain preventive protocols, quality metrics, and best practice recommendations. A written personalized care plan for preventive services as well as general preventive health recommendations were provided to patient.     Seabron Cypress [place], [place]   12/31/1094   After Visit Summary: (MyChart) Due to this being a telephonic visit, the after visit summary with patients personalized plan was offered to patient via MyChart   Notes: Nothing significant to report at this time.

## 2024-04-30 NOTE — Patient Instructions (Signed)
 [name] , Thank you for taking time to come for your Medicare Wellness Visit. I appreciate your ongoing commitment to your health goals. Please review the following plan we discussed and let me know if I can assist you in the future.   Referrals/Orders/Follow-Ups/Clinician Recommendations: You have an order for:  []   2D Mammogram  [x]   3D Mammogram  [x]   Bone Density     Please call for appointment:  [facility] [address] [place], [place] [address] [phone]    Make sure to wear two-piece clothing.  No lotions, powders, or deodorants the day of the appointment. Make sure to bring picture ID and insurance card.  Bring list of medications you are currently taking including any supplements.    This is a list of the screening recommended for you and due dates:  Health Maintenance  Topic Date Due   Mammogram  10/09/2022   Medicare Annual Wellness Visit  02/08/2024   Flu Shot  07/26/2024   DTaP/Tdap/Td vaccine (2 - Td or Tdap) 11/04/2024   Colon Cancer Screening  08/28/2030   Pneumonia Vaccine  Completed   DEXA scan (bone density measurement)  Completed   Hepatitis C Screening  Completed   HPV Vaccine  Aged Out   Meningitis B Vaccine  Aged Out   COVID-19 Vaccine  Discontinued   Zoster (Shingles) Vaccine  Discontinued    Advanced directives: (Provided) Advance directive discussed with you today. I have provided a copy for you to complete at home and have notarized. Once this is complete, please bring a copy in to our office so we can scan it into your chart.   Next Medicare Annual Wellness Visit scheduled for next year: Yes  Have you seen your provider in the last 6 months (3 months if uncontrolled diabetes)? Yes

## 2024-05-09 ENCOUNTER — Encounter (HOSPITAL_BASED_OUTPATIENT_CLINIC_OR_DEPARTMENT_OTHER): Payer: Self-pay

## 2024-05-09 ENCOUNTER — Ambulatory Visit (HOSPITAL_BASED_OUTPATIENT_CLINIC_OR_DEPARTMENT_OTHER)
Admission: RE | Admit: 2024-05-09 | Discharge: 2024-05-09 | Disposition: A | Discharge status: Home / Self Care | Source: Ambulatory Visit | Attending: Family Medicine | Admitting: Family Medicine

## 2024-05-09 DIAGNOSIS — Z1231 Encounter for screening mammogram for malignant neoplasm of breast: Secondary | ICD-10-CM | POA: Diagnosis present

## 2024-06-20 ENCOUNTER — Telehealth (HOSPITAL_BASED_OUTPATIENT_CLINIC_OR_DEPARTMENT_OTHER): Payer: Self-pay

## 2024-08-05 ENCOUNTER — Ambulatory Visit: Payer: Self-pay | Admitting: Family Medicine

## 2024-08-05 ENCOUNTER — Ambulatory Visit (HOSPITAL_BASED_OUTPATIENT_CLINIC_OR_DEPARTMENT_OTHER)
Admission: RE | Admit: 2024-08-05 | Discharge: 2024-08-05 | Disposition: A | Discharge status: Home / Self Care | Source: Ambulatory Visit | Attending: Family Medicine | Admitting: Family Medicine

## 2024-08-05 DIAGNOSIS — Z780 Asymptomatic menopausal state: Secondary | ICD-10-CM | POA: Diagnosis present
